# Patient Record
Sex: Female | Born: 1945 | Race: White | Hispanic: No | Marital: Married | Smoking: Never smoker
Health system: Southern US, Community
[De-identification: ages and names within clinical notes are randomized; demographics above are authoritative.]

## PROBLEM LIST (undated history)

## (undated) DIAGNOSIS — I10 Essential (primary) hypertension: Secondary | ICD-10-CM

## (undated) DIAGNOSIS — I4891 Unspecified atrial fibrillation: Secondary | ICD-10-CM

## (undated) HISTORY — PX: BREAST SURGERY: SHX581

## (undated) HISTORY — PX: APPENDECTOMY: SHX54

---

## 1979-07-22 HISTORY — PX: REDUCTION MAMMAPLASTY: SUR839

## 2004-10-09 ENCOUNTER — Ambulatory Visit: Payer: Self-pay | Admitting: Obstetrics and Gynecology

## 2005-03-20 ENCOUNTER — Ambulatory Visit: Payer: Self-pay | Admitting: Obstetrics and Gynecology

## 2005-12-03 ENCOUNTER — Ambulatory Visit: Payer: Self-pay | Admitting: Obstetrics and Gynecology

## 2005-12-04 ENCOUNTER — Ambulatory Visit: Payer: Self-pay | Admitting: General Surgery

## 2006-01-20 ENCOUNTER — Ambulatory Visit: Payer: Self-pay | Admitting: Plastic Surgery

## 2006-01-20 ENCOUNTER — Other Ambulatory Visit: Payer: Self-pay

## 2006-11-17 ENCOUNTER — Emergency Department: Payer: Self-pay | Admitting: Emergency Medicine

## 2006-12-16 ENCOUNTER — Ambulatory Visit: Payer: Self-pay | Admitting: Obstetrics and Gynecology

## 2007-12-20 ENCOUNTER — Ambulatory Visit: Payer: Self-pay | Admitting: Obstetrics and Gynecology

## 2008-12-20 ENCOUNTER — Ambulatory Visit: Payer: Self-pay | Admitting: Obstetrics and Gynecology

## 2010-01-10 ENCOUNTER — Ambulatory Visit: Payer: Self-pay | Admitting: Obstetrics and Gynecology

## 2011-01-15 ENCOUNTER — Ambulatory Visit: Payer: Self-pay | Admitting: Obstetrics and Gynecology

## 2012-01-16 ENCOUNTER — Ambulatory Visit: Payer: Self-pay | Admitting: Obstetrics and Gynecology

## 2017-12-02 ENCOUNTER — Other Ambulatory Visit: Payer: Self-pay

## 2017-12-02 ENCOUNTER — Emergency Department
Admission: EM | Admit: 2017-12-02 | Discharge: 2017-12-02 | Disposition: A | Discharge status: Home / Self Care | Payer: Medicare Other | Attending: Emergency Medicine | Admitting: Emergency Medicine

## 2017-12-02 ENCOUNTER — Emergency Department: Payer: Medicare Other

## 2017-12-02 DIAGNOSIS — R002 Palpitations: Secondary | ICD-10-CM | POA: Diagnosis present

## 2017-12-02 DIAGNOSIS — I4891 Unspecified atrial fibrillation: Secondary | ICD-10-CM

## 2017-12-02 LAB — BASIC METABOLIC PANEL
Anion gap: 12 (ref 5–15)
BUN: 9 mg/dL (ref 6–20)
CALCIUM: 9.2 mg/dL (ref 8.9–10.3)
CO2: 22 mmol/L (ref 22–32)
CREATININE: 0.97 mg/dL (ref 0.44–1.00)
Chloride: 102 mmol/L (ref 101–111)
GFR calc Af Amer: >60 mL/min (ref >60)
GFR, EST NON AFRICAN AMERICAN: 57 mL/min — AB (ref >60)
GLUCOSE: 106 mg/dL — AB (ref 65–99)
Potassium: 4.5 mmol/L (ref 3.5–5.1)
Sodium: 136 mmol/L (ref 135–145)

## 2017-12-02 LAB — CBC
HCT: 45.9 % (ref 35.0–47.0)
Hemoglobin: 15.6 g/dL (ref 12.0–16.0)
MCH: 33.7 pg (ref 26.0–34.0)
MCHC: 33.9 g/dL (ref 32.0–36.0)
MCV: 99.4 fL (ref 80.0–100.0)
PLATELETS: 246 10*3/uL (ref 150–440)
RBC: 4.62 MIL/uL (ref 3.80–5.20)
RDW: 13 % (ref 11.5–14.5)
WBC: 7.8 10*3/uL (ref 3.6–11.0)

## 2017-12-02 LAB — TROPONIN I

## 2017-12-02 MED ORDER — DILTIAZEM HCL ER COATED BEADS 120 MG PO CP24: 120.0000 mg | ORAL_CAPSULE | Freq: Once | ORAL | Status: DC

## 2017-12-02 MED ORDER — DILTIAZEM HCL 60 MG PO TABS
60.0000 mg | ORAL_TABLET | Freq: Once | ORAL | Status: AC
  Administered 2017-12-02: 60 mg via ORAL
  Filled 2017-12-02: qty 1

## 2017-12-02 MED ORDER — DILTIAZEM HCL 25 MG/5ML IV SOLN
10.0000 mg | Freq: Once | INTRAVENOUS | Status: AC
  Administered 2017-12-02: 10 mg via INTRAVENOUS
  Filled 2017-12-02: qty 5

## 2017-12-02 MED ORDER — DILTIAZEM HCL 25 MG/5ML IV SOLN
10.0000 mg | Freq: Once | INTRAVENOUS | Status: AC
  Administered 2017-12-02: 10 mg via INTRAVENOUS

## 2017-12-02 MED ORDER — DILTIAZEM HCL ER COATED BEADS 120 MG PO CP24: 120.0000 mg | ORAL_CAPSULE | Freq: Every day | ORAL | 0 refills | Status: DC

## 2017-12-02 MED ORDER — SODIUM CHLORIDE 0.9 % IV BOLUS
1000.0000 mL | Freq: Once | INTRAVENOUS | Status: AC
  Administered 2017-12-02: 1000 mL via INTRAVENOUS

## 2017-12-02 NOTE — ED Provider Notes (Signed)
[facility] Emergency Department Provider Note  Time seen: 2:40 PM  I have reviewed the triage vital signs and the nursing notes.   HISTORY  Chief Complaint Cough    HPI Gloria Gill is a 72 y.o. female with no past medical history besides past alcohol use, takes no prescription medications, presents to the emergency department for cough, shortness of breath with exertion and palpitations.  According to the patient for the past 1 month she has noticed a dry cough, states she always has a mild cough but it has been worse.  Over the past 2 weeks she has been feeling short of breath especially with exertion and is feeling palpitations especially when she lies down at night.  Patient denies any chest pain.  Denies any shortness of breath at rest.  Denies any nausea or diaphoresis.  Patient denies any fever or infectious symptoms.  Largely negative review of systems otherwise.    History reviewed. No pertinent past medical history.  There are no active problems to display for this patient.   History reviewed. No pertinent surgical history.  Prior to Admission medications   Not on File    No Known Allergies  No family history on file.  Social History Social History   Tobacco Use  . Smoking status: Not on file  Substance Use Topics  . Alcohol use: Not Currently    Frequency: Never  . Drug use: Never    Review of Systems Constitutional: Negative for fever. Eyes: Negative for visual complaints ENT: Negative for recent illness/congestion Cardiovascular: Negative for chest pain.  Intermittent palpitations at night x2 weeks. Respiratory: Shortness of breath with exertion x2 weeks Gastrointestinal: Negative for abdominal pain, vomiting and diarrhea. Genitourinary: Negative for urinary compaints Musculoskeletal: Negative for leg pain or swelling. Skin: Negative for skin complaints  Neurological: Negative for headache All other ROS  negative  ____________________________________________   PHYSICAL EXAM:  VITAL SIGNS: ED Triage Vitals [12/02/17 1340]  Enc Vitals Group     BP 100/79     Pulse Rate (!) 141     Resp 18     Temp 98.5 F (36.9 C)     Temp Source Oral     SpO2 95 %     Weight 175 lb (79.4 kg)     Height 5\' 9"  (1.753 m)     Head Circumference      Peak Flow      Pain Score 0     Pain Loc      Pain Edu?      Excl. in [place]?    Constitutional: Alert and oriented. Well appearing and in no distress. Eyes: Normal exam ENT   Head: Normocephalic and atraumatic.   Mouth/Throat: Mucous membranes are moist. Cardiovascular: Irregular rhythm, rate around 115. Respiratory: Normal respiratory effort without tachypnea nor retractions. Breath sounds are clear  Gastrointestinal: Soft and nontender. No distention.  Musculoskeletal: Nontender with normal range of motion in all extremities. No lower extremity tenderness or edema. Neurologic:  Normal speech and language. No gross focal neurologic deficits Skin:  Skin is warm, dry and intact.  Psychiatric: Mood and affect are normal. Speech and behavior are normal.   ____________________________________________    EKG  EKG reviewed and interpreted by myself shows atrial fibrillation with rapid ventricular response at 145 bpm, narrow QRS, normal axis, normal intervals besides slight QTC prolongation, nonspecific ST changes.  Repeat EKG 15: 24: 04 reviewed and interpreted by myself shows atrial fibrillation at 94  bpm with a narrow QRS, normal axis,'s nonspecific ST changes.  ____________________________________________    RADIOLOGY  Chest x-ray shows cardiac enlargement with small bilateral effusions.  ____________________________________________   INITIAL IMPRESSION / ASSESSMENT AND PLAN / ED COURSE  Pertinent labs & imaging results that were available during my care of the patient were reviewed by me and considered in my medical decision making  (see chart for details).  Patient presents to the emergency department for dry cough, shortness of breath with exertion and intermittent palpitations over the past 2 weeks.  Patient found to be in rapid A. fib upon arrival.  Denies any chest pain, no shortness of breath at rest.  Differential would include atrial fibrillation with rapid ventricular response, ACS, alleged letter metabolic abnormality.  We will check labs, treat with diltiazem, IV hydrate, and continue to closely monitor.  Patient agreeable to this plan of care.  Patient's heart rate has come down with diltiazem after the second bolus patient's heart rate is maintaining in the 80s and 90s.  Patient is feeling better.  Chest x-ray shows possible mild fluid overload.  Labs are largely within normal limits including normal kidney function, normal H&H and negative troponin.  We will discuss with unassigned cardiology for further recommendations.  I will cover the patient was 60 mg of oral diltiazem to attempt to maintain rate control.  I discussed the patient with Dr. Ubaldo Glassing.  He recommends holding off on anticoagulation at this time.  He will see in the office tomorrow at 1130.  We will discharge with diltiazem.  Patient agreeable to this plan of care.   CRITICAL CARE Performed by: Harvest Dark   Total critical care time: 30 minutes  Critical care time was exclusive of separately billable procedures and treating other patients.  Critical care was necessary to treat or prevent imminent or life-threatening deterioration.  Critical care was time spent personally by me on the following activities: development of treatment plan with patient and/or surrogate as well as nursing, discussions with consultants, evaluation of patient's response to treatment, examination of patient, obtaining history from patient or surrogate, ordering and performing treatments and interventions, ordering and review of laboratory studies, ordering and review  of radiographic studies, pulse oximetry and re-evaluation of patient's condition.  ____________________________________________   FINAL CLINICAL IMPRESSION(S) / ED DIAGNOSES  Atrial fibrillation with rapid ventricular response    Harvest Dark, MD 12/02/17 1535

## 2017-12-02 NOTE — Discharge Instructions (Addendum)
Please follow-up with Dr. Ubaldo Glassing in the office tomorrow at 11:30 AM.  Return to the emergency department for any chest pain or trouble breathing.  Please take your medication as prescribed starting tomorrow morning.

## 2017-12-02 NOTE — ED Notes (Signed)
No hx of irregular HB

## 2017-12-02 NOTE — ED Triage Notes (Signed)
To ER via POV c/o acute bronchitis. Pt states that she was started on azithromycin and benzonate Monday. Pt states she has dry cough and has not improved. Pt alert and oriented X4, active, cooperative, pt in NAD. RR even and unlabored, color WNL.

## 2017-12-08 DIAGNOSIS — I4891 Unspecified atrial fibrillation: Secondary | ICD-10-CM | POA: Insufficient documentation

## 2018-05-06 DIAGNOSIS — I429 Cardiomyopathy, unspecified: Secondary | ICD-10-CM

## 2018-05-06 DIAGNOSIS — F1010 Alcohol abuse, uncomplicated: Secondary | ICD-10-CM | POA: Diagnosis present

## 2018-05-06 DIAGNOSIS — F411 Generalized anxiety disorder: Secondary | ICD-10-CM | POA: Insufficient documentation

## 2018-05-22 ENCOUNTER — Other Ambulatory Visit: Payer: Self-pay

## 2018-05-22 ENCOUNTER — Emergency Department
Admission: EM | Admit: 2018-05-22 | Discharge: 2018-05-22 | Disposition: A | Discharge status: Home / Self Care | Payer: Medicare Other | Attending: Emergency Medicine | Admitting: Emergency Medicine

## 2018-05-22 ENCOUNTER — Encounter: Payer: Self-pay | Admitting: Emergency Medicine

## 2018-05-22 DIAGNOSIS — I10 Essential (primary) hypertension: Secondary | ICD-10-CM | POA: Insufficient documentation

## 2018-05-22 DIAGNOSIS — R040 Epistaxis: Secondary | ICD-10-CM | POA: Insufficient documentation

## 2018-05-22 DIAGNOSIS — Z7901 Long term (current) use of anticoagulants: Secondary | ICD-10-CM | POA: Diagnosis not present

## 2018-05-22 DIAGNOSIS — Z79899 Other long term (current) drug therapy: Secondary | ICD-10-CM | POA: Diagnosis not present

## 2018-05-22 HISTORY — DX: Unspecified atrial fibrillation: I48.91

## 2018-05-22 HISTORY — DX: Essential (primary) hypertension: I10

## 2018-05-22 LAB — BASIC METABOLIC PANEL
Anion gap: 11 (ref 5–15)
BUN: 15 mg/dL (ref 8–23)
CALCIUM: 9.6 mg/dL (ref 8.9–10.3)
CO2: 26 mmol/L (ref 22–32)
CREATININE: 0.83 mg/dL (ref 0.44–1.00)
Chloride: 102 mmol/L (ref 98–111)
Glucose, Bld: 106 mg/dL — ABNORMAL HIGH (ref 70–99)
Potassium: 3.9 mmol/L (ref 3.5–5.1)
SODIUM: 139 mmol/L (ref 135–145)

## 2018-05-22 LAB — CBC
HCT: 45.3 % (ref 36.0–46.0)
Hemoglobin: 15.3 g/dL — ABNORMAL HIGH (ref 12.0–15.0)
MCH: 34 pg (ref 26.0–34.0)
MCHC: 33.8 g/dL (ref 30.0–36.0)
MCV: 100.7 fL — ABNORMAL HIGH (ref 80.0–100.0)
Platelets: 255 10*3/uL (ref 150–400)
RBC: 4.5 MIL/uL (ref 3.87–5.11)
RDW: 11.8 % (ref 11.5–15.5)
WBC: 8.9 10*3/uL (ref 4.0–10.5)
nRBC: 0 % (ref 0.0–0.2)

## 2018-05-22 LAB — PROTIME-INR
INR: 1.18
PROTHROMBIN TIME: 14.9 s (ref 11.4–15.2)

## 2018-05-22 LAB — APTT: aPTT: 31 seconds (ref 24–36)

## 2018-05-22 NOTE — ED Provider Notes (Signed)
[facility] Emergency Department Provider Note   ____________________________________________   First MD Initiated Contact with Patient 05/22/18 1255     (approximate)  I have reviewed the triage vital signs and the nursing notes.   HISTORY  Chief Complaint Epistaxis    HPI Gloria Gill is a 72 y.o. female here for evaluation of a nosebleed  Patient reports she began expensing bleeding from her left nose yesterday.  She reports its been oozing, she was able to get stopped with pressure and stuffing a tissue paper in it.  This morning after sneezing it began to ooze once again, prompting her to come for evaluation.  She reports she does take blood thinner for her A. fib, last took at 630 this morning.  No history of any known blood clots.  She does report the bleeding has since stopped, she held pressure over the left side of the nose and stop bleeding.  No fevers or chills.  Does not feel blood running down the back of her throat.  No vomiting.  No lightheadedness or weakness.  No recent change in her medications.   Past Medical History:  Diagnosis Date  . Atrial fibrillation ([place])   . Hypertension     There are no active problems to display for this patient.   Past Surgical History:  Procedure Laterality Date  . APPENDECTOMY    . BREAST SURGERY      Prior to Admission medications   Medication Sig Start Date End Date Taking? Authorizing Provider  albuterol (PROVENTIL HFA;VENTOLIN HFA) 108 (90 Base) MCG/ACT inhaler Inhale 1-2 puffs into the lungs every 6 (six) hours as needed. 11/30/17   [provider]  azithromycin (ZITHROMAX) 250 MG tablet Take 2 tablets (500MG ) by mouth first day then take 1 tablet (250MG ) by mouth daily for next 4 days 11/30/17   [provider]  benzonatate (TESSALON) 200 MG capsule Take 1 capsule by mouth 3 (three) times daily as needed. 11/30/17   [provider]  diltiazem (CARDIZEM CD)  120 MG 24 hr capsule Take 1 capsule (120 mg total) by mouth daily. 12/02/17 12/02/18  Harvest Dark, MD    Allergies Patient has no known allergies.  No family history on file.  Social History Social History   Tobacco Use  . Smoking status: Never Smoker  Substance Use Topics  . Alcohol use: Not Currently    Frequency: Never  . Drug use: Never    Review of Systems Constitutional: No fever/chills ENT: No sore throat.  See HPI regarding epistaxis.  No bleeding from the gumline. Cardiovascular: Denies chest pain. Respiratory: Denies shortness of breath. Gastrointestinal: No abdominal pain.   Genitourinary: Negative for blood in the urine..  Musculoskeletal: Negative for back pain. Skin: No easy bruising. Neurological: Negative for headaches or weakness.    ____________________________________________   PHYSICAL EXAM:  VITAL SIGNS: ED Triage Vitals  Enc Vitals Group     BP 05/22/18 1115 (!) 149/106     Pulse Rate 05/22/18 1115 (!) 53     Resp 05/22/18 1115 18     Temp 05/22/18 1115 98 F (36.7 C)     Temp Source 05/22/18 1115 Oral     SpO2 05/22/18 1115 95 %     Weight 05/22/18 1118 173 lb (78.5 kg)     Height 05/22/18 1118 5' 10.5" (1.791 m)     Head Circumference --      Peak Flow --      Pain  Score 05/22/18 1117 0     Pain Loc --      Pain Edu? --      Excl. in [place]? --     Constitutional: Alert and oriented. Well appearing and in no acute distress. Eyes: Conjunctivae are normal. Head: Atraumatic. Nose: No congestion/rhinnorhea.  The right nare is clear and free from any bleeding.  The left nare has adherent clot without evidence of bleeding in the anterior portion of the nare.  There is no blood or bleeding in the posterior pharynx.  Patient reports the left side of the nose is no longer bleeding Mouth/Throat: Mucous membranes are moist. Neck: No stridor.  Cardiovascular: Normal rate, regular rhythm. Grossly normal heart sounds.  Good peripheral  circulation. Respiratory: Normal respiratory effort.  No retractions. Lungs CTAB. Gastrointestinal: Soft and nontender. No distention. Musculoskeletal: No lower extremity tenderness nor edema. Neurologic:  Normal speech and language. No gross focal neurologic deficits are appreciated.  Skin:  Skin is warm, dry and intact. No rash noted. Psychiatric: Mood and affect are normal. Speech and behavior are normal.  ____________________________________________   LABS (all labs ordered are listed, but only abnormal results are displayed)  Labs Reviewed  CBC - Abnormal; Notable for the following components:      Result Value   Hemoglobin 15.3 (*)    MCV 100.7 (*)    All other components within normal limits  BASIC METABOLIC PANEL - Abnormal; Notable for the following components:   Glucose, Bld 106 (*)    All other components within normal limits  PROTIME-INR  APTT   ____________________________________________  EKG   ____________________________________________  RADIOLOGY   ____________________________________________   PROCEDURES  Procedure(s) performed: None  Procedures  Critical Care performed: No  ____________________________________________   INITIAL IMPRESSION / ASSESSMENT AND PLAN / ED COURSE  Pertinent labs & imaging results that were available during my care of the patient were reviewed by me and considered in my medical decision making (see chart for details).   Patient returns for evaluation of epistaxis.  Labs checked as she is on Eliquis.  At the time of presentation her nose is no longer bleeding after she applied anterior pressure and use of Kleenex.  There is no evidence of ongoing bleeding.  Patient resting comfortably no distress, labs checked platelet count normal and renal function normal.  Has a bleeding is stopped I do not believe we should withdraw her anticoagulation, but will refer her for follow-up.  Discussed careful return precautions and at  home she recommendations as well as return precautions for epistaxis.  Patient and family in agreement  Return precautions and treatment recommendations and follow-up discussed with the patient who is agreeable with the plan.  ----------------------------------------- 2:34 PM on 05/22/2018 -----------------------------------------  Patient observed, no ongoing epistaxis on reevaluation at this time either.  Discharged home      ____________________________________________   FINAL CLINICAL IMPRESSION(S) / ED DIAGNOSES  Final diagnoses:  Left-sided epistaxis        Note:  This document was prepared using Dragon voice recognition software and may include unintentional dictation errors       Delman Kitten, MD 05/22/18 1434

## 2018-05-22 NOTE — ED Triage Notes (Signed)
Nosebleed L draining from L nare since yesterday. Denies injury. On Eliquis. States has been slowing down since yesterday. Has tissue in L nare with noted small amount of pink drainage.

## 2018-05-22 NOTE — Discharge Instructions (Addendum)
As we discussed, there are several techniques you can use to prevent or stop nosebleeds in the future.  Keep your nose moist either with saline spray several times a day.  If the bleeding starts up again, gently blow your nose into a tissue to clear the blood and clots, then apply 1-2 sprays to each affected nostril of over-the-counter Afrin nasal spray (oxymetazoline).   Then squeeze your nose shut tightly and DO NOT PEEK for at least 15 minutes.  This will resolve most nosebleeds.  If you continue to have trouble after trying these techniques, or anything seems out of the ordinary or concerns you, please return tot he Emergency Department.

## 2020-05-22 ENCOUNTER — Other Ambulatory Visit: Payer: Self-pay | Admitting: Internal Medicine

## 2020-05-22 DIAGNOSIS — Z1231 Encounter for screening mammogram for malignant neoplasm of breast: Secondary | ICD-10-CM

## 2020-07-26 ENCOUNTER — Other Ambulatory Visit: Payer: Self-pay

## 2020-07-26 ENCOUNTER — Ambulatory Visit
Admission: RE | Admit: 2020-07-26 | Discharge: 2020-07-26 | Disposition: A | Discharge status: Home / Self Care | Payer: Medicare PPO | Source: Ambulatory Visit | Attending: Internal Medicine | Admitting: Internal Medicine

## 2020-07-26 DIAGNOSIS — Z1231 Encounter for screening mammogram for malignant neoplasm of breast: Secondary | ICD-10-CM

## 2020-09-19 ENCOUNTER — Emergency Department: Payer: Medicare PPO

## 2020-09-19 ENCOUNTER — Inpatient Hospital Stay
Admission: EM | Admit: 2020-09-19 | Discharge: 2020-10-02 | DRG: 660 | Disposition: A | Discharge status: Home / Self Care | Payer: Medicare PPO | Attending: Internal Medicine | Admitting: Internal Medicine

## 2020-09-19 ENCOUNTER — Other Ambulatory Visit: Payer: Self-pay

## 2020-09-19 DIAGNOSIS — C801 Malignant (primary) neoplasm, unspecified: Secondary | ICD-10-CM | POA: Diagnosis present

## 2020-09-19 DIAGNOSIS — Z79899 Other long term (current) drug therapy: Secondary | ICD-10-CM | POA: Diagnosis not present

## 2020-09-19 DIAGNOSIS — Z7901 Long term (current) use of anticoagulants: Secondary | ICD-10-CM

## 2020-09-19 DIAGNOSIS — I4891 Unspecified atrial fibrillation: Secondary | ICD-10-CM | POA: Diagnosis present

## 2020-09-19 DIAGNOSIS — N136 Pyonephrosis: Secondary | ICD-10-CM | POA: Diagnosis present

## 2020-09-19 DIAGNOSIS — I5020 Unspecified systolic (congestive) heart failure: Secondary | ICD-10-CM

## 2020-09-19 DIAGNOSIS — I110 Hypertensive heart disease with heart failure: Secondary | ICD-10-CM | POA: Diagnosis present

## 2020-09-19 DIAGNOSIS — F459 Somatoform disorder, unspecified: Secondary | ICD-10-CM | POA: Diagnosis present

## 2020-09-19 DIAGNOSIS — F1020 Alcohol dependence, uncomplicated: Secondary | ICD-10-CM | POA: Diagnosis present

## 2020-09-19 DIAGNOSIS — I10 Essential (primary) hypertension: Secondary | ICD-10-CM | POA: Diagnosis not present

## 2020-09-19 DIAGNOSIS — N179 Acute kidney failure, unspecified: Principal | ICD-10-CM | POA: Diagnosis present

## 2020-09-19 DIAGNOSIS — N132 Hydronephrosis with renal and ureteral calculous obstruction: Secondary | ICD-10-CM | POA: Diagnosis not present

## 2020-09-19 DIAGNOSIS — K5900 Constipation, unspecified: Secondary | ICD-10-CM | POA: Diagnosis present

## 2020-09-19 DIAGNOSIS — Z888 Allergy status to other drugs, medicaments and biological substances status: Secondary | ICD-10-CM | POA: Diagnosis not present

## 2020-09-19 DIAGNOSIS — G893 Neoplasm related pain (acute) (chronic): Secondary | ICD-10-CM | POA: Diagnosis not present

## 2020-09-19 DIAGNOSIS — F451 Undifferentiated somatoform disorder: Secondary | ICD-10-CM

## 2020-09-19 DIAGNOSIS — Z20822 Contact with and (suspected) exposure to covid-19: Secondary | ICD-10-CM | POA: Diagnosis present

## 2020-09-19 DIAGNOSIS — Z791 Long term (current) use of non-steroidal anti-inflammatories (NSAID): Secondary | ICD-10-CM

## 2020-09-19 DIAGNOSIS — C7911 Secondary malignant neoplasm of bladder: Secondary | ICD-10-CM | POA: Diagnosis present

## 2020-09-19 DIAGNOSIS — N135 Crossing vessel and stricture of ureter without hydronephrosis: Secondary | ICD-10-CM | POA: Diagnosis not present

## 2020-09-19 DIAGNOSIS — N3289 Other specified disorders of bladder: Secondary | ICD-10-CM | POA: Diagnosis not present

## 2020-09-19 DIAGNOSIS — R109 Unspecified abdominal pain: Secondary | ICD-10-CM

## 2020-09-19 DIAGNOSIS — C579 Malignant neoplasm of female genital organ, unspecified: Secondary | ICD-10-CM | POA: Diagnosis not present

## 2020-09-19 DIAGNOSIS — Z515 Encounter for palliative care: Secondary | ICD-10-CM | POA: Diagnosis not present

## 2020-09-19 DIAGNOSIS — R310 Gross hematuria: Secondary | ICD-10-CM | POA: Diagnosis not present

## 2020-09-19 DIAGNOSIS — F1011 Alcohol abuse, in remission: Secondary | ICD-10-CM | POA: Diagnosis not present

## 2020-09-19 DIAGNOSIS — I426 Alcoholic cardiomyopathy: Secondary | ICD-10-CM | POA: Diagnosis present

## 2020-09-19 DIAGNOSIS — I5022 Chronic systolic (congestive) heart failure: Secondary | ICD-10-CM | POA: Diagnosis present

## 2020-09-19 DIAGNOSIS — F1010 Alcohol abuse, uncomplicated: Secondary | ICD-10-CM | POA: Diagnosis present

## 2020-09-19 DIAGNOSIS — I429 Cardiomyopathy, unspecified: Secondary | ICD-10-CM

## 2020-09-19 DIAGNOSIS — N1330 Unspecified hydronephrosis: Secondary | ICD-10-CM

## 2020-09-19 DIAGNOSIS — F332 Major depressive disorder, recurrent severe without psychotic features: Secondary | ICD-10-CM | POA: Diagnosis present

## 2020-09-19 DIAGNOSIS — I480 Paroxysmal atrial fibrillation: Secondary | ICD-10-CM | POA: Diagnosis present

## 2020-09-19 DIAGNOSIS — D494 Neoplasm of unspecified behavior of bladder: Secondary | ICD-10-CM | POA: Diagnosis not present

## 2020-09-19 DIAGNOSIS — R45851 Suicidal ideations: Secondary | ICD-10-CM

## 2020-09-19 LAB — CBC
HCT: 40.3 % (ref 36.0–46.0)
Hemoglobin: 13.3 g/dL (ref 12.0–15.0)
MCH: 31 pg (ref 26.0–34.0)
MCHC: 33 g/dL (ref 30.0–36.0)
MCV: 93.9 fL (ref 80.0–100.0)
Platelets: 404 10*3/uL — ABNORMAL HIGH (ref 150–400)
RBC: 4.29 MIL/uL (ref 3.87–5.11)
RDW: 12.4 % (ref 11.5–15.5)
WBC: 12.7 10*3/uL — ABNORMAL HIGH (ref 4.0–10.5)
nRBC: 0 % (ref 0.0–0.2)

## 2020-09-19 LAB — URINE DRUG SCREEN, QUALITATIVE (ARMC ONLY)
Amphetamines, Ur Screen: NOT DETECTED
Barbiturates, Ur Screen: NOT DETECTED
Benzodiazepine, Ur Scrn: NOT DETECTED
Cannabinoid 50 Ng, Ur ~~LOC~~: NOT DETECTED
Cocaine Metabolite,Ur ~~LOC~~: NOT DETECTED
MDMA (Ecstasy)Ur Screen: NOT DETECTED
Methadone Scn, Ur: NOT DETECTED
Opiate, Ur Screen: POSITIVE — AB
Phencyclidine (PCP) Ur S: NOT DETECTED
Tricyclic, Ur Screen: NOT DETECTED

## 2020-09-19 LAB — COMPREHENSIVE METABOLIC PANEL
ALT: 21 U/L (ref 0–44)
AST: 25 U/L (ref 15–41)
Albumin: 3.8 g/dL (ref 3.5–5.0)
Alkaline Phosphatase: 49 U/L (ref 38–126)
Anion gap: 14 (ref 5–15)
BUN: 33 mg/dL — ABNORMAL HIGH (ref 8–23)
CO2: 20 mmol/L — ABNORMAL LOW (ref 22–32)
Calcium: 9.2 mg/dL (ref 8.9–10.3)
Chloride: 104 mmol/L (ref 98–111)
Creatinine, Ser: 3.08 mg/dL — ABNORMAL HIGH (ref 0.44–1.00)
GFR, Estimated: 15 mL/min — ABNORMAL LOW (ref >60)
Glucose, Bld: 210 mg/dL — ABNORMAL HIGH (ref 70–99)
Potassium: 4.1 mmol/L (ref 3.5–5.1)
Sodium: 138 mmol/L (ref 135–145)
Total Bilirubin: 0.8 mg/dL (ref 0.3–1.2)
Total Protein: 7.2 g/dL (ref 6.5–8.1)

## 2020-09-19 LAB — URINALYSIS, COMPLETE (UACMP) WITH MICROSCOPIC
Bilirubin Urine: NEGATIVE
Glucose, UA: NEGATIVE mg/dL
Ketones, ur: NEGATIVE mg/dL
Nitrite: NEGATIVE
Protein, ur: 30 mg/dL — AB
Specific Gravity, Urine: 1.01 (ref 1.005–1.030)
pH: 5 (ref 5.0–8.0)

## 2020-09-19 LAB — LIPASE, BLOOD: Lipase: 35 U/L (ref 11–51)

## 2020-09-19 LAB — SALICYLATE LEVEL: Salicylate Lvl: <7 mg/dL — ABNORMAL LOW (ref 7.0–30.0)

## 2020-09-19 LAB — SARS CORONAVIRUS 2 (TAT 6-24 HRS): SARS Coronavirus 2: NEGATIVE

## 2020-09-19 LAB — ACETAMINOPHEN LEVEL: Acetaminophen (Tylenol), Serum: <10 ug/mL — ABNORMAL LOW (ref 10–30)

## 2020-09-19 LAB — ETHANOL: Alcohol, Ethyl (B): <10 mg/dL (ref <10)

## 2020-09-19 MED ORDER — METOPROLOL TARTRATE 5 MG/5ML IV SOLN
5.0000 mg | Freq: Once | INTRAVENOUS | Status: AC
  Administered 2020-09-19: 5 mg via INTRAVENOUS

## 2020-09-19 MED ORDER — SODIUM CHLORIDE 0.9% FLUSH
3.0000 mL | Freq: Two times a day (BID) | INTRAVENOUS | Status: DC
  Administered 2020-09-19 – 2020-10-02 (×26): 3 mL via INTRAVENOUS

## 2020-09-19 MED ORDER — MORPHINE SULFATE (PF) 4 MG/ML IV SOLN
4.0000 mg | Freq: Once | INTRAVENOUS | Status: AC
  Administered 2020-09-19: 4 mg via INTRAVENOUS
  Filled 2020-09-19: qty 1

## 2020-09-19 MED ORDER — SODIUM CHLORIDE 0.9 % IV SOLN
1000.0000 mL | Freq: Once | INTRAVENOUS | Status: AC
  Administered 2020-09-19: 1000 mL via INTRAVENOUS

## 2020-09-19 MED ORDER — METOPROLOL TARTRATE 5 MG/5ML IV SOLN
INTRAVENOUS | Status: AC
  Administered 2020-09-19: 5 mg via INTRAVENOUS
  Filled 2020-09-19: qty 5

## 2020-09-19 MED ORDER — METOPROLOL TARTRATE 5 MG/5ML IV SOLN: 5.0000 mg | Freq: Once | INTRAVENOUS | Status: AC

## 2020-09-19 MED ORDER — ONDANSETRON HCL 4 MG/2ML IJ SOLN
4.0000 mg | Freq: Once | INTRAMUSCULAR | Status: AC
  Administered 2020-09-19: 4 mg via INTRAVENOUS
  Filled 2020-09-19: qty 2

## 2020-09-19 MED ORDER — SODIUM CHLORIDE 0.9% FLUSH
3.0000 mL | INTRAVENOUS | Status: DC | PRN
  Administered 2020-09-21: 3 mL via INTRAVENOUS

## 2020-09-19 MED ORDER — HEPARIN SODIUM (PORCINE) 5000 UNIT/ML IJ SOLN: 5000.0000 [IU] | Freq: Three times a day (TID) | INTRAMUSCULAR | Status: DC

## 2020-09-19 MED ORDER — TRAZODONE HCL 50 MG PO TABS
25.0000 mg | ORAL_TABLET | Freq: Every evening | ORAL | Status: DC | PRN
  Administered 2020-09-19 – 2020-09-21 (×3): 25 mg via ORAL
  Filled 2020-09-19 (×3): qty 1

## 2020-09-19 MED ORDER — SODIUM CHLORIDE 0.9 % IV SOLN: 250.0000 mL | INTRAVENOUS | Status: DC | PRN

## 2020-09-19 MED ORDER — METOPROLOL SUCCINATE ER 100 MG PO TB24
100.0000 mg | ORAL_TABLET | Freq: Every day | ORAL | Status: DC
  Administered 2020-09-19 – 2020-09-30 (×12): 100 mg via ORAL
  Filled 2020-09-19 (×5): qty 1
  Filled 2020-09-19: qty 2
  Filled 2020-09-19 (×3): qty 1
  Filled 2020-09-19: qty 2
  Filled 2020-09-19 (×2): qty 1

## 2020-09-19 MED ORDER — APIXABAN 5 MG PO TABS
5.0000 mg | ORAL_TABLET | Freq: Two times a day (BID) | ORAL | Status: DC
  Administered 2020-09-19: 5 mg via ORAL
  Filled 2020-09-19: qty 1

## 2020-09-19 MED ORDER — MORPHINE SULFATE (PF) 4 MG/ML IV SOLN
4.0000 mg | INTRAVENOUS | Status: DC | PRN
  Administered 2020-09-20 – 2020-09-28 (×4): 4 mg via INTRAVENOUS
  Filled 2020-09-19 (×4): qty 1

## 2020-09-19 MED ORDER — DILTIAZEM HCL 25 MG/5ML IV SOLN
5.0000 mg | Freq: Once | INTRAVENOUS | Status: AC
  Administered 2020-09-19: 5 mg via INTRAVENOUS
  Filled 2020-09-19: qty 5

## 2020-09-19 NOTE — Consult Note (Signed)
Urology Consult  I have been asked to see the patient by Dr. Si Raider, for evaluation and management of hydronephrosis/acute kidney injury.  Chief Complaint: Flank pain, urinary incontinence  History of Present Illness: Gloria Gill is a 75 y.o. year old female who presents to the emergency room today with various complaints including crampy lower abdominal pain, low back pain and new onset urinary incontinence.  As part of her evaluation, she underwent routine labs indicating newly identified acute renal insufficiency with a creatinine of 3, previous baseline normal as well as noncontrast CT scan indicating new bilateral hydronephrosis down to the level of a decompressed bladder.  She reports that about 3 weeks ago, she went to her primary care complaining of new onset urinary incontinence.  She complained of urgency, frequency, lower abdominal discomfort as well as severe incontinence.  She is now  having to wear incontinence pads.  She denies any gross hematuria or dysuria.  She does not think the oxybutynin has helped much this prescribed by her primary care physician.  She denies any personal history of urinary symptoms prior to this.  She is a never smoker.  She did endorse some suicidal ideations being evaluated by psychiatry today.  Urinalysis today shows a few white blood cells, likely contaminant without an overt infection.  She has been taking antibiotics for presumed clinical diverticulitis.  Previous imaging dating back to 2006 shows no evidence of hydronephrosis bilaterally.  Past Medical History:  Diagnosis Date  . Atrial fibrillation ([place])   . Hypertension     Past Surgical History:  Procedure Laterality Date  . APPENDECTOMY    . BREAST SURGERY    . REDUCTION MAMMAPLASTY Bilateral 1981   pt stated she had implants with these scars/had implants removed 2009    Home Medications:  Current Meds  Medication Sig  . ELIQUIS 5 MG TABS tablet Take 5 mg by mouth  2 (two) times daily.  . metoprolol succinate (TOPROL-XL) 100 MG 24 hr tablet Take 100 mg by mouth daily.  Marland Kitchen oxybutynin (DITROPAN-XL) 10 MG 24 hr tablet Take 10 mg by mouth daily.  . [DISCONTINUED] amoxicillin-clavulanate (AUGMENTIN) 875-125 MG tablet Take 1 tablet by mouth every 12 (twelve) hours.    Allergies:  Allergies  Allergen Reactions  . Lisinopril Nausea Only    Family History  Problem Relation Age of Onset  . Breast cancer Neg Hx     Social History:  reports that she has never smoked. She has never used smokeless tobacco. She reports previous alcohol use. She reports that she does not use drugs.  ROS: A complete review of systems was performed.  All systems are negative except for pertinent findings as noted.  Physical Exam:  Vital signs in last 24 hours: Temp:  [98.4 F (36.9 C)] 98.4 F (36.9 C) (03/02 0941) Pulse Rate:  [53-155] 149 (03/02 1631) Resp:  [15-18] 15 (03/02 1631) BP: (137-138)/(98-114) 138/98 (03/02 1549) SpO2:  [93 %-100 %] 95 % (03/02 1631) Weight:  [72.6 kg] 72.6 kg (03/02 0948) Constitutional:  Alert and oriented, No acute distress HEENT: [place] AT, moist mucus membranes.  Trachea midline, no masses Respiratory: Normal respiratory effort GI: Abdomen is soft, nontender, nondistended, no abdominal masses GU: No CVA tenderness Neurologic: Grossly intact, no focal deficits, moving all 4 extremities Psychiatric: Normal mood and affect   Laboratory Data:  Recent Labs    09/19/20 0945  WBC 12.7*  HGB 13.3  HCT 40.3   Recent Labs  09/19/20 0945  NA 138  K 4.1  CL 104  CO2 20*  GLUCOSE 210*  BUN 33*  CREATININE 3.08*  CALCIUM 9.2   Component     Latest Ref Rng & Units 09/19/2020  Color, Urine     YELLOW YELLOW (A)  Appearance     CLEAR HAZY (A)  Specific Gravity, Urine     1.005 - 1.030 1.010  pH     5.0 - 8.0 5.0  Glucose, UA     NEGATIVE mg/dL NEGATIVE  Hgb urine dipstick     NEGATIVE MODERATE (A)  Bilirubin Urine      NEGATIVE NEGATIVE  Ketones, ur     NEGATIVE mg/dL NEGATIVE  Protein     NEGATIVE mg/dL 30 (A)  Nitrite     NEGATIVE NEGATIVE  Leukocytes,Ua     NEGATIVE MODERATE (A)  RBC / HPF     0 - 5 RBC/hpf 0-5  WBC, UA     0 - 5 WBC/hpf 6-10  Bacteria, UA     NONE SEEN FEW (A)  Squamous Epithelial / LPF     0 - 5 0-5  Hyaline Casts, UA      PRESENT    Radiologic Imaging: CT ABDOMEN PELVIS WO CONTRAST  Result Date: 09/19/2020 CLINICAL DATA:  Left lower quadrant pain. EXAM: CT ABDOMEN AND PELVIS WITHOUT CONTRAST TECHNIQUE: Multidetector CT imaging of the abdomen and pelvis was performed following the standard protocol without IV contrast. COMPARISON:  03/20/2005 FINDINGS: Lower chest: Small right pleural effusion.  No confluent opacities. Hepatobiliary: No focal hepatic abnormality. Gallbladder unremarkable. Pancreas: No focal abnormality or ductal dilatation. Spleen: No focal abnormality.  Normal size. Adrenals/Urinary Tract: Bilateral hydronephrosis. No renal or ureteral stones. The ureters are dilated to the urinary bladder which is decompressed. Adrenal glands unremarkable. Stomach/Bowel: Colonic diverticulosis diffusely. No active diverticulitis. Stomach and small bowel decompressed, unremarkable. Vascular/Lymphatic: Aortic atherosclerosis. No evidence of aneurysm or adenopathy. Reproductive: Uterus and adnexa unremarkable.  No mass. Other: Free fluid noted in the pelvis anterior to the uterus. No free air. Musculoskeletal: No acute bony abnormality. IMPRESSION: Diffuse colonic diverticulosis.  No active diverticulitis. Bilateral hydronephrosis of unknown etiology. Ureters are dilated to the bladder. No visible stones. Bladder is decompressed. Small amount of free fluid in the pelvis. Aortic atherosclerosis. Small right pleural effusion. Electronically Signed   By: Rolm Baptise M.D.   On: 09/19/2020 10:44   CT scan was personally reviewed.  Agree with radiologic interpretation.  The urinary bladder  is intact decompressed albeit slightly thickened and irregular which may be related to decompressed state versus underlying bladder pathology.  Impression/ Plan:  75 year old female with new onset severe urinary urgency/urge incontinence with what appears to be new bilateral hydronephrosis.  Review of CT scan indicates as above, the bladder does appear to be mildly thickened albeit decompressed so evaluation is somewhat difficult.  Differential diagnosis for the above includes cystitis although unlikely (urine culture sent to rule this out), chronic bladder dysfunction with low volume high pressure bladder versus possibly even a bladder malignancy.  She has as acute kidney injury with a creatinine of 3.  It is unclear whether or not her hydronephrosis is obstructive in nature.  I recommend a Foley catheter for maximal urinary decompression at this time at least ~creatinine continues to trend downward.  May be helpful to check a renal ultrasound in 48 hours if she does improve to see if her hydronephrosis has resolved.  She may need urodynamics as an  outpatient as well as cystoscopy.  -Follow-up urine culture -Maintain Foley catheter until creatinine continues to improve -Consider renal ultrasound in 48 hours to see if hydronephrosis resolves with catheter in place -Outpatient cystoscopy +/- urodynamics   09/19/2020, 4:32 PM  Hollice Espy,  MD   Urology will continue to follow this patient.

## 2020-09-19 NOTE — ED Notes (Signed)
Pt requesting sleep aid -- will notify on call provider

## 2020-09-19 NOTE — ED Triage Notes (Addendum)
Pt comes via [place] with c/o abdominal pain. Pt states this started two weeks ago. Pt states she is having SI and plans to wreck her car. Pt states she just can't stand the pain anymore. Pt states she can't eat or sleep.  Pt states her husband at home is not helpful. Pt denies any HI. Pt denies any drug or alcohol.  Pt tearful in triage. Pt states she needs help.

## 2020-09-19 NOTE — ED Notes (Signed)
Pt still needs labs drawn

## 2020-09-19 NOTE — ED Notes (Addendum)
Pt noted to be in AFIB with RVR, rate as high as 164. EKG obtained, MD [facility] contacted via secure chart. EKG given to ER physician Charna Archer, see order for 5mg  iv metoprolol. Pt denies symptoms at this time. Denies CP, denies SHOB.  BP 138/98.

## 2020-09-19 NOTE — ED Notes (Addendum)
Pt dressed out in hospital attire. Pt's belongings to include: 1 brown bra 1 gray mask 1 blue shirt 1 gray pants 2 white socks 2 gray shoes 1 black hair tie 2 bobby pins 1 white underwear

## 2020-09-19 NOTE — Consult Note (Signed)
ANTICOAGULATION CONSULT NOTE - Consult  Pharmacy Consult for Eliquis Indication: atrial fibrillation  Allergies  Allergen Reactions  . Lisinopril Nausea Only    Patient Measurements: Height: 5' 9.5" (176.5 cm) Weight: 72.6 kg (160 lb) IBW/kg (Calculated) : 67.35  Vital Signs: Temp: 98.4 F (36.9 C) (03/02 0941) BP: 137/114 (03/02 0941) Pulse Rate: 53 (03/02 0941)  Labs: Recent Labs    09/19/20 0945  HGB 13.3  HCT 40.3  PLT 404*  CREATININE 3.08*    Estimated Creatinine Clearance: 16.5 mL/min (A) (by C-G formula based on SCr of 3.08 mg/dL (H)).   Medications:  Eliquis 5mg  BID PTA, no other APT.  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 Pharmacy consulted for the assessment and restart of PTA Eliquis.   Plan:  H/H/Plts WNL, no s/sy of bleed. AKI to be assessed by urology and decompress bladder. Creatinine prior was WNL, but will continue to monitor labs.  Last Eliquis dose 3/2 @ 0630; Will restart Eliquis 5mg  BID @1900 .  Lorna Dibble 09/19/2020,3:09 PM

## 2020-09-19 NOTE — ED Notes (Signed)
Dr Weber Cooks at bedside talking with pt.

## 2020-09-19 NOTE — Consult Note (Signed)
[facility] Face-to-Face Psychiatry Consult   Reason for Consult:   Consult for 75 year old woman with a history of alcohol abuse and medical problems who comes in with abdominal pain and suicidal ideation Referring Physician:  [facility] Patient Identification: Gloria Gill MRN:  [id number] Principal Diagnosis: Severe recurrent major depression without psychotic features ([place]) Diagnosis:  Principal Problem:   Severe recurrent major depression without psychotic features ([place]) Active Problems:   Alcohol abuse   Cardiomyopathy ([place])   AKI (acute kidney injury) ([place])   HFrEF (heart failure with reduced ejection fraction) ([place])   Hydronephrosis   Suicidal ideation   Somatic symptom disorder   Total Time spent with patient: 1 hour  Subjective:   Gloria Gill is a 75 y.o. female patient admitted with "I cannot live like this".  HPI: Patient came to the emergency room complaining of abdominal pain.  Along the way she made statements about how she would kill herself if she had to go home in her current condition.  Patient tells me that for about the last 2 weeks she has had severe pain across the lower section bilaterally of her abdomen.  It is constant but gets worse when she eats.  It does not change with bowel movements or urinating.  Patient has been to doctors without any relief so far.  States that the pain keeps her awake at night and she cannot sleep.  Not eating well.  She says that she has thoughts about killing herself by wrecking her car if the pain does not get better.  Not currently receiving any mental health treatment.  Denies any hallucinations or psychosis.  Denies homicidal ideation.  Patient is generally seeming very down and negative.  It is easy to get her talking about how angry she is at her husband and complaining about other negative things in her life as well as the pain.  Past Psychiatric History: Past history of alcohol abuse says she has not had a drink in about 3  years.  Past history of some depression.  Denies past suicide attempts.  Risk to Self:   Risk to Others:   Prior Inpatient Therapy:   Prior Outpatient Therapy:    Past Medical History:  Past Medical History:  Diagnosis Date  . Atrial fibrillation ([place])   . Hypertension     Past Surgical History:  Procedure Laterality Date  . APPENDECTOMY    . BREAST SURGERY    . REDUCTION MAMMAPLASTY Bilateral 1981   pt stated she had implants with these scars/had implants removed 2009   Family History:  Family History  Problem Relation Age of Onset  . Breast cancer Neg Hx    Family Psychiatric  History: See previous Social History:  Social History   Substance and Sexual Activity  Alcohol Use Not Currently     Social History   Substance and Sexual Activity  Drug Use Never    Social History   Socioeconomic History  . Marital status: Married    Spouse name: Not on file  . Number of children: Not on file  . Years of education: Not on file  . Highest education level: Not on file  Occupational History  . Not on file  Tobacco Use  . Smoking status: Never Smoker  . Smokeless tobacco: Never Used  Substance and Sexual Activity  . Alcohol use: Not Currently  . Drug use: Never  . Sexual activity: Not on file  Other Topics Concern  . Not on file  Social  History Narrative  . Not on file   Social Determinants of Health   Financial Resource Strain: Not on file  Food Insecurity: Not on file  Transportation Needs: Not on file  Physical Activity: Not on file  Stress: Not on file  Social Connections: Not on file   Additional Social History:    Allergies:   Allergies  Allergen Reactions  . Lisinopril Nausea Only    Labs:  Results for orders placed or performed during the hospital encounter of 09/19/20 (from the past 48 hour(s))  Comprehensive metabolic panel     Status: Abnormal   Collection Time: 09/19/20  9:45 AM  Result Value Ref Range   Sodium 138 135 - 145 mmol/L    Potassium 4.1 3.5 - 5.1 mmol/L   Chloride 104 98 - 111 mmol/L   CO2 20 (L) 22 - 32 mmol/L   Glucose, Bld 210 (H) 70 - 99 mg/dL    Comment: Glucose reference range applies only to samples taken after fasting for at least 8 hours.   BUN 33 (H) 8 - 23 mg/dL   Creatinine, Ser 3.08 (H) 0.44 - 1.00 mg/dL   Calcium 9.2 8.9 - 10.3 mg/dL   Total Protein 7.2 6.5 - 8.1 g/dL   Albumin 3.8 3.5 - 5.0 g/dL   AST 25 15 - 41 U/L   ALT 21 0 - 44 U/L   Alkaline Phosphatase 49 38 - 126 U/L   Total Bilirubin 0.8 0.3 - 1.2 mg/dL   GFR, Estimated 15 (L) >60 mL/min    Comment: (NOTE) Calculated using the CKD-EPI Creatinine Equation (2021)    Anion gap 14 5 - 15    Comment: Performed at [facility], [address] [place], [place] [address]  Ethanol     Status: None   Collection Time: 09/19/20  9:45 AM  Result Value Ref Range   Alcohol, Ethyl (B) <10 <10 mg/dL    Comment: (NOTE) Lowest detectable limit for serum alcohol is 10 mg/dL.  For medical purposes only. Performed at [facility], [address] [place], [place] [address]   Salicylate level     Status: Abnormal   Collection Time: 09/19/20  9:45 AM  Result Value Ref Range   Salicylate Lvl <8.1 (L) 7.0 - 30.0 mg/dL    Comment: Performed at [facility], [address] [place], [place] [address]  Acetaminophen level     Status: Abnormal   Collection Time: 09/19/20  9:45 AM  Result Value Ref Range   Acetaminophen (Tylenol), Serum <10 (L) 10 - 30 ug/mL    Comment: (NOTE) Therapeutic concentrations vary significantly. A range of 10-30 ug/mL  may be an effective concentration for many patients. However, some  are best treated at concentrations outside of this range. Acetaminophen concentrations >150 ug/mL at 4 hours after ingestion  and >50 ug/mL at 12 hours after ingestion are often associated with  toxic reactions.  Performed at [facility], [address] [place], [place] [address]   cbc      Status: Abnormal   Collection Time: 09/19/20  9:45 AM  Result Value Ref Range   WBC 12.7 (H) 4.0 - 10.5 K/uL   RBC 4.29 3.87 - 5.11 MIL/uL   Hemoglobin 13.3 12.0 - 15.0 g/dL   HCT 40.3 36.0 - 46.0 %   MCV 93.9 80.0 - 100.0 fL   MCH 31.0 26.0 - 34.0 pg   MCHC 33.0 30.0 - 36.0 g/dL   RDW 12.4  11.5 - 15.5 %   Platelets 404 (H) 150 - 400 K/uL   nRBC 0.0 0.0 - 0.2 %    Comment: Performed at [facility], [address] [place], [place] [address]  Lipase, blood     Status: None   Collection Time: 09/19/20  9:45 AM  Result Value Ref Range   Lipase 35 11 - 51 U/L    Comment: Performed at [facility], [address] [place], [place] [address]  Urine Drug Screen, Qualitative     Status: Abnormal   Collection Time: 09/19/20 12:14 PM  Result Value Ref Range   Tricyclic, Ur Screen NONE DETECTED NONE DETECTED   Amphetamines, Ur Screen NONE DETECTED NONE DETECTED   MDMA (Ecstasy)Ur Screen NONE DETECTED NONE DETECTED   Cocaine Metabolite,Ur [place] NONE DETECTED NONE DETECTED   Opiate, Ur Screen POSITIVE (A) NONE DETECTED   Phencyclidine (PCP) Ur S NONE DETECTED NONE DETECTED   Cannabinoid 50 Ng, Ur [place] NONE DETECTED NONE DETECTED   Barbiturates, Ur Screen NONE DETECTED NONE DETECTED   Benzodiazepine, Ur Scrn NONE DETECTED NONE DETECTED   Methadone Scn, Ur NONE DETECTED NONE DETECTED    Comment: (NOTE) Tricyclics + metabolites, urine    Cutoff 1000 ng/mL Amphetamines + metabolites, urine  Cutoff 1000 ng/mL MDMA (Ecstasy), urine              Cutoff 500 ng/mL Cocaine Metabolite, urine          Cutoff 300 ng/mL Opiate + metabolites, urine        Cutoff 300 ng/mL Phencyclidine (PCP), urine         Cutoff 25 ng/mL Cannabinoid, urine                 Cutoff 50 ng/mL Barbiturates + metabolites, urine  Cutoff 200 ng/mL Benzodiazepine, urine              Cutoff 200 ng/mL Methadone, urine                   Cutoff 300 ng/mL  The urine drug screen provides only a preliminary,  unconfirmed analytical test result and should not be used for non-medical purposes. Clinical consideration and professional judgment should be applied to any positive drug screen result due to possible interfering substances. A more specific alternate chemical method must be used in order to obtain a confirmed analytical result. Gas chromatography / mass spectrometry (GC/MS) is the preferred confirm atory method. Performed at [facility], [address] [place], [place] [address]   Urinalysis, Complete w Microscopic     Status: Abnormal   Collection Time: 09/19/20  3:01 PM  Result Value Ref Range   Color, Urine YELLOW (A) YELLOW   APPearance HAZY (A) CLEAR   Specific Gravity, Urine 1.010 1.005 - 1.030   pH 5.0 5.0 - 8.0   Glucose, UA NEGATIVE NEGATIVE mg/dL   Hgb urine dipstick MODERATE (A) NEGATIVE   Bilirubin Urine NEGATIVE NEGATIVE   Ketones, ur NEGATIVE NEGATIVE mg/dL   Protein, ur 30 (A) NEGATIVE mg/dL   Nitrite NEGATIVE NEGATIVE   Leukocytes,Ua MODERATE (A) NEGATIVE   RBC / HPF 0-5 0 - 5 RBC/hpf   WBC, UA 6-10 0 - 5 WBC/hpf   Bacteria, UA FEW (A) NONE SEEN   Squamous Epithelial / LPF 0-5 0 - 5   Hyaline Casts, UA PRESENT     Comment: Performed at [facility], [address], [place], [place] [address]  Current Facility-Administered Medications  Medication Dose Route Frequency Provider Last Rate Last Admin  . 0.9 %  sodium chloride infusion  250 mL Intravenous PRN Wouk, Ailene Rud, MD      . apixaban [facility]) tablet 5 mg  5 mg Oral BID Lorna Dibble, RPH      . metoprolol succinate (TOPROL-XL) 24 hr tablet 100 mg  100 mg Oral Daily Wouk, Ailene Rud, MD      . morphine 4 MG/ML injection 4 mg  4 mg Intravenous Q4H PRN Wouk, Ailene Rud, MD      . sodium chloride flush (NS) 0.9 % injection 3 mL  3 mL Intravenous Q12H Wouk, Ailene Rud, MD   3 mL at 09/19/20 1300  . sodium chloride flush (NS) 0.9 % injection 3 mL  3 mL Intravenous PRN Wouk,  Ailene Rud, MD       Current Outpatient Medications  Medication Sig Dispense Refill  . ELIQUIS 5 MG TABS tablet Take 5 mg by mouth 2 (two) times daily.    . metoprolol succinate (TOPROL-XL) 100 MG 24 hr tablet Take 100 mg by mouth daily.    Marland Kitchen oxybutynin (DITROPAN-XL) 10 MG 24 hr tablet Take 10 mg by mouth daily.    . furosemide (LASIX) 20 MG tablet Take 20 mg by mouth daily. (Patient not taking: No sig reported)    . losartan (COZAAR) 25 MG tablet Take 25 mg by mouth daily. (Patient not taking: No sig reported)    . meloxicam (MOBIC) 15 MG tablet Take 15 mg by mouth daily. (Patient not taking: No sig reported)    . venlafaxine XR (EFFEXOR-XR) 37.5 MG 24 hr capsule Take 37.5 mg by mouth daily. (Patient not taking: No sig reported)    . Vitamin D, Ergocalciferol, (DRISDOL) 1.25 MG (50000 UNIT) CAPS capsule Take 50,000 Units by mouth once a week.      Musculoskeletal: Strength & Muscle Tone: within normal limits Gait & Station: normal Patient leans: N/A  Psychiatric Specialty Exam: Physical Exam Constitutional:      Appearance: She is well-developed and well-nourished.  HENT:     Head: Normocephalic and atraumatic.  Eyes:     Conjunctiva/sclera: Conjunctivae normal.     Pupils: Pupils are equal, round, and reactive to light.  Cardiovascular:     Heart sounds: Normal heart sounds.  Pulmonary:     Effort: Pulmonary effort is normal.  Abdominal:     Palpations: Abdomen is soft.  Musculoskeletal:        General: Normal range of motion.     Cervical back: Normal range of motion.  Skin:    General: Skin is warm and dry.  Neurological:     Mental Status: She is alert.  Psychiatric:        Attention and Perception: Attention normal.        Mood and Affect: Mood is depressed.        Speech: Speech normal.        Behavior: Behavior is cooperative.        Thought Content: Thought content includes suicidal ideation. Thought content includes suicidal plan.        Cognition and  Memory: Cognition normal.        Judgment: Judgment is impulsive.     Review of Systems  Constitutional: Negative.   HENT: Negative.   Eyes: Negative.   Respiratory: Negative.   Cardiovascular: Negative.   Gastrointestinal: Positive for abdominal pain.  Musculoskeletal: Negative.   Skin: Negative.  Neurological: Negative.   Psychiatric/Behavioral: Positive for dysphoric mood and suicidal ideas.    Blood pressure (!) 138/98, pulse (!) 155, temperature 98.4 F (36.9 C), resp. rate 18, height 5' 9.5" (1.765 m), weight 72.6 kg, SpO2 100 %.Body mass index is 23.29 kg/m.  General Appearance: Casual  Eye Contact:  Fair  Speech:  Slow  Volume:  Decreased  Mood:  Depressed  Affect:  Constricted  Thought Process:  Goal Directed  Orientation:  Full (Time, Place, and Person)  Thought Content:  Logical  Suicidal Thoughts:  Yes.  without intent/plan  Homicidal Thoughts:  No  Memory:  Immediate;   Fair Recent;   Fair Remote;   Fair  Judgement:  Impaired  Insight:  Shallow  Psychomotor Activity:  Decreased  Concentration:  Concentration: Poor  Recall:  [employer] of Knowledge:  Fair  Language:  Fair  Akathisia:  No  Handed:  Right  AIMS (if indicated):     Assets:  Desire for Improvement Housing  ADL's:  Impaired  Cognition:  Impaired,  Mild  Sleep:        Treatment Plan Summary: Medication management and Plan 75 year old woman who comes in with primarily somatic complaints of abdominal pain but who is very depressed and it is very easy to get her talking about being suicidal sad disappointed with her life disliking her husband etc.  Not clear what the cause of the abdominal pain is but I suspect she may have a real depression as well.  She is very clear that she is not going to harm her self in the hospital.  Does not require a sitter or suicide precautions.  I told her I would like to start her on mirtazapine with a plan to titrate up to an effective dose.  Patient is  agreeable.  I will continue to follow-up when she is admitted.  Disposition: Supportive therapy provided about ongoing stressors. Continue to follow-up with treatment of depression  Alethia Berthold, MD 09/19/2020 4:27 PM

## 2020-09-19 NOTE — ED Triage Notes (Signed)
First RN Note: Pt to ED via wheelchair from [facility], this RN received phone call from pt's PCP for report. Per pt's PCP pt seen 1 week ago for LLQ abdominal pain and placed on Augmentin for possible diverticulitis, per PCP pt also with noted elevated creatinine, per PCP pt opted for oral rehydration at home, returned to PCP today with c/o 10/10 LLQ abdominal pain. Per PCP pt made statement that she would kill herself if they sent her home.

## 2020-09-19 NOTE — ED Notes (Addendum)
[facility] notified of pt's continuous afib w/ RVR. Pt remains asymptomatic, denies CP, denies SHOB. HR ranges from 125-140. BP 135/98 at this time. See verbal order for IV lopressor 5mg , repeat in 66min if HR sustains afib RVR/ does not improve. Verbal order placed.

## 2020-09-19 NOTE — H&P (Addendum)
History and Physical    Gloria Gill KZS:[id number] DOB: [birth year] DOA: 09/19/2020  PCP: [name], MD  Patient coming from: home   Chief Complaint: abd pain  HPI: Gloria Gill is a 75 y.o. female with medical history significant for etoh (sober 3 years), a fib on eliquis, etoh cardiomyopathy, who presents with the above.  Few weeks of crampy suprapubic pain. Seen by pcp a week or so ago, clinical dx diverticulitis, started on augmentin. Didn't improved. Labs at f/u showed aki so referred to ED. Patient reports she was started on oxybutynin 3 weeks ago for urinary incontinence. No diarrhea or vomiting. No chest pain. No dysuria or fevers. Is making urine but thinks urinating less. No history kidney stones. Denies hematuria.  Told ED provider she's thinking of harming herself. Endorses SI and says plan is to drive into something with her car. 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 Denies alcohol or drug use.  ED Course:   Labs, imaging  Review of Systems: As per HPI otherwise 10 point review of systems negative.    Past Medical History:  Diagnosis Date  . Atrial fibrillation ([place])   . Hypertension     Past Surgical History:  Procedure Laterality Date  . APPENDECTOMY    . BREAST SURGERY    . REDUCTION MAMMAPLASTY Bilateral 1981   pt stated she had implants with these scars/had implants removed 2009     reports that she has never smoked. She has never used smokeless tobacco. She reports previous alcohol use. She reports that she does not use drugs.  Allergies  Allergen Reactions  . Lisinopril Nausea Only    Family History  Problem Relation Age of Onset  . Breast cancer Neg Hx     Prior to Admission medications   Medication Sig Start Date End Date Taking? Authorizing Provider  ELIQUIS 5 MG TABS tablet Take 5 mg by mouth 2 (two) times daily. 08/31/20  Yes [provider]  metoprolol succinate (TOPROL-XL) 100 MG  24 hr tablet Take 100 mg by mouth daily. 09/08/20  Yes [provider]  oxybutynin (DITROPAN-XL) 10 MG 24 hr tablet Take 10 mg by mouth daily. 08/04/20  Yes [provider]  furosemide (LASIX) 20 MG tablet Take 20 mg by mouth daily. Patient not taking: No sig reported 07/26/20   [provider]  losartan (COZAAR) 25 MG tablet Take 25 mg by mouth daily. Patient not taking: No sig reported 07/26/20   [provider]  meloxicam (MOBIC) 15 MG tablet Take 15 mg by mouth daily. Patient not taking: No sig reported 05/22/20   [provider]  venlafaxine XR (EFFEXOR-XR) 37.5 MG 24 hr capsule Take 37.5 mg by mouth daily. Patient not taking: No sig reported 09/03/20   [provider]  Vitamin D, Ergocalciferol, (DRISDOL) 1.25 MG (50000 UNIT) CAPS capsule Take 50,000 Units by mouth once a week. 07/27/20   [provider]    Physical Exam: Vitals:   09/19/20 0941 09/19/20 0948  BP: (!) 137/114   Pulse: (!) 53   Resp: 18   Temp: 98.4 F (36.9 C)   SpO2: 100%   Weight:  72.6 kg  Height:  5' 9.5" (1.765 m)    Constitutional: No acute distress Head: Atraumatic Eyes: Conjunctiva clear ENM: Moist mucous membranes. Normal dentition.  Neck: Supple Respiratory: Clear to auscultation bilaterally, no wheezing/rales/rhonchi. Normal respiratory effort. No accessory muscle use. . Cardiovascular: Regular rate and rhythm. No  murmurs/rubs/gallops. Abdomen: Non-tender, non-distended. No masses. No rebound or guarding. Positive bowel sounds. Musculoskeletal: No joint deformity upper and lower extremities. Normal ROM, no contractures. Normal muscle tone.  Skin: No rashes, lesions, or ulcers.  Extremities: No peripheral edema. Palpable peripheral pulses. Neurologic: Alert, moving all 4 extremities. Psychiatric: Normal insight and judgement.   Labs on Admission: I have personally reviewed following labs and imaging studies  CBC: Recent Labs  Lab  09/19/20 0945  WBC 12.7*  HGB 13.3  HCT 40.3  MCV 93.9  PLT 176*   Basic Metabolic Panel: Recent Labs  Lab 09/19/20 0945  NA 138  K 4.1  CL 104  CO2 20*  GLUCOSE 210*  BUN 33*  CREATININE 3.08*  CALCIUM 9.2   GFR: Estimated Creatinine Clearance: 16.5 mL/min (A) (by C-G formula based on SCr of 3.08 mg/dL (H)). Liver Function Tests: Recent Labs  Lab 09/19/20 0945  AST 25  ALT 21  ALKPHOS 49  BILITOT 0.8  PROT 7.2  ALBUMIN 3.8   Recent Labs  Lab 09/19/20 0945  LIPASE 35   No results for input(s): AMMONIA in the last 168 hours. Coagulation Profile: No results for input(s): INR, PROTIME in the last 168 hours. Cardiac Enzymes: No results for input(s): CKTOTAL, CKMB, CKMBINDEX, TROPONINI in the last 168 hours. BNP (last 3 results) No results for input(s): PROBNP in the last 8760 hours. HbA1C: No results for input(s): HGBA1C in the last 72 hours. CBG: No results for input(s): GLUCAP in the last 168 hours. Lipid Profile: No results for input(s): CHOL, HDL, LDLCALC, TRIG, CHOLHDL, LDLDIRECT in the last 72 hours. Thyroid Function Tests: No results for input(s): TSH, T4TOTAL, FREET4, T3FREE, THYROIDAB in the last 72 hours. Anemia Panel: No results for input(s): VITAMINB12, FOLATE, FERRITIN, TIBC, IRON, RETICCTPCT in the last 72 hours. Urine analysis: No results found for: COLORURINE, APPEARANCEUR, [place], [place], [place], HGBUR, BILIRUBINUR, KETONESUR, PROTEINUR, UROBILINOGEN, NITRITE, LEUKOCYTESUR  Radiological Exams on Admission: CT ABDOMEN PELVIS WO CONTRAST  Result Date: 09/19/2020 CLINICAL DATA:  Left lower quadrant pain. EXAM: CT ABDOMEN AND PELVIS WITHOUT CONTRAST TECHNIQUE: Multidetector CT imaging of the abdomen and pelvis was performed following the standard protocol without IV contrast. COMPARISON:  03/20/2005 FINDINGS: Lower chest: Small right pleural effusion.  No confluent opacities. Hepatobiliary: No focal hepatic abnormality. Gallbladder  unremarkable. Pancreas: No focal abnormality or ductal dilatation. Spleen: No focal abnormality.  Normal size. Adrenals/Urinary Tract: Bilateral hydronephrosis. No renal or ureteral stones. The ureters are dilated to the urinary bladder which is decompressed. Adrenal glands unremarkable. Stomach/Bowel: Colonic diverticulosis diffusely. No active diverticulitis. Stomach and small bowel decompressed, unremarkable. Vascular/Lymphatic: Aortic atherosclerosis. No evidence of aneurysm or adenopathy. Reproductive: Uterus and adnexa unremarkable.  No mass. Other: Free fluid noted in the pelvis anterior to the uterus. No free air. Musculoskeletal: No acute bony abnormality. IMPRESSION: Diffuse colonic diverticulosis.  No active diverticulitis. Bilateral hydronephrosis of unknown etiology. Ureters are dilated to the bladder. No visible stones. Bladder is decompressed. Small amount of free fluid in the pelvis. Aortic atherosclerosis. Small right pleural effusion. Electronically Signed   By: Rolm Baptise M.D.   On: 09/19/2020 10:44     Assessment/Plan Active Problems:   Alcohol abuse   Cardiomyopathy ([place])   AKI (acute kidney injury) ([place])   HFrEF (heart failure with reduced ejection fraction) ([place])   Hydronephrosis   Suicidal ideation   # AKI # Bilateral hydronephrosis This appears to be a post-renal AKI. Cr 3 from normal previously. . CT does not show cause  of obstruction. As started oxybutynin 3 weeks ago and aki noted shortly after that, suspect this may be secondary to urinary retention. 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 Per cardiology record most recent EF 40%. Appears compensated - hold lasix and losartan as above - cont home  metoprolol  # Paroxysmal a fib - cont home eliquis, [place] dose per pharmacy - cont home metop  DVT prophylaxis: heparin Code Status: full  Family Communication: none @ bedside  Consults called: urology, psychiatry  Level of care: Med-Surg Status is: Inpatient  Remains inpatient appropriate because:Inpatient level of care appropriate due to severity of illness   Dispo: The patient is from: Home              Anticipated d/c is to: Home              Patient currently is not medically stable to d/c.   Difficult to place patient No        Desma Maxim MD [facility] Pager [phone]  If 7PM-7AM, please contact night-coverage [email or web address] Password [facility]  09/19/2020, 2:58 PM

## 2020-09-19 NOTE — ED Notes (Signed)
Pt to CT

## 2020-09-19 NOTE — ED Provider Notes (Signed)
[facility] Emergency Department Provider Note   ____________________________________________    I have reviewed the triage vital signs and the nursing notes.   HISTORY  Chief Complaint Abdominal Pain and SI     HPI Gloria Gill is a 75 y.o. female who presents with complaints of lower abdominal pain.  Patient describes 2 weeks of pain.  She saw her PCP today for reevaluation after being treated with antibiotics for presumed diverticulitis with no improvement.  Made SI comments related to the pain if discharged home so sent to the emergency department.  Complains of cramping and sharp pain in the lower abdomen, left greater than right.  No dysuria.  No hematuria reported.  History of an appendectomy.  No fevers or chills, decreased p.o. intake, feels dehydrated.  Past Medical History:  Diagnosis Date  . Atrial fibrillation ([place])   . Hypertension     Patient Active Problem List   Diagnosis Date Noted  . Alcohol abuse 05/06/2018  . Cardiomyopathy ([place]) 05/06/2018  . Atrial fibrillation ([place]) 12/08/2017    Past Surgical History:  Procedure Laterality Date  . APPENDECTOMY    . BREAST SURGERY    . REDUCTION MAMMAPLASTY Bilateral 1981   pt stated she had implants with these scars/had implants removed 2009    Prior to Admission medications   Medication Sig Start Date End Date Taking? Authorizing Provider  albuterol (PROVENTIL HFA;VENTOLIN HFA) 108 (90 Base) MCG/ACT inhaler Inhale 1-2 puffs into the lungs every 6 (six) hours as needed. 11/30/17   [provider]  azithromycin (ZITHROMAX) 250 MG tablet Take 2 tablets (500MG ) by mouth first day then take 1 tablet (250MG ) by mouth daily for next 4 days 11/30/17   [provider]  benzonatate (TESSALON) 200 MG capsule Take 1 capsule by mouth 3 (three) times daily as needed. 11/30/17   [provider]  diltiazem (CARDIZEM CD) 120 MG 24 hr capsule Take 1 capsule (120 mg  total) by mouth daily. 12/02/17 12/02/18  Harvest Dark, MD     Allergies Patient has no known allergies.  Family History  Problem Relation Age of Onset  . Breast cancer Neg Hx     Social History Social History   Tobacco Use  . Smoking status: Never Smoker  . Smokeless tobacco: Never Used  Substance Use Topics  . Alcohol use: Not Currently  . Drug use: Never    Review of Systems  Constitutional: No fever/chills Eyes: No visual changes.  ENT: No sore throat. Cardiovascular: Denies chest pain. Respiratory: Denies shortness of breath. Gastrointestinal: As above Genitourinary: Negative for dysuria. Musculoskeletal: Negative for back pain. Skin: Negative for rash. Neurological: Negative for headaches    ____________________________________________   PHYSICAL EXAM:  VITAL SIGNS: ED Triage Vitals  Enc Vitals Group     BP 09/19/20 0941 (!) 137/114     Pulse Rate 09/19/20 0941 (!) 53     Resp 09/19/20 0941 18     Temp 09/19/20 0941 98.4 F (36.9 C)     Temp src --      SpO2 09/19/20 0941 100 %     Weight 09/19/20 0948 72.6 kg (160 lb)     Height 09/19/20 0948 1.765 m (5' 9.5")     Head Circumference --      Peak Flow --      Pain Score 09/19/20 0941 10     Pain Loc --      Pain Edu? --  Excl. in [place]? --     Constitutional: Alert and oriented.   Nose: No congestion/rhinnorhea. Mouth/Throat: Mucous membranes are moist.   Neck:  Painless ROM Cardiovascular: Normal rate, regular rhythm. Grossly normal heart sounds.  Good peripheral circulation. Respiratory: Normal respiratory effort.  No retractions. Lungs CTAB. Gastrointestinal: Soft, mild tenderness left lower quadrant.  No distention.  No CVA tenderness.  Musculoskeletal: No lower extremity tenderness nor edema.  Warm and well perfused Neurologic:  Normal speech and language. No gross focal neurologic deficits are appreciated.  Skin:  Skin is warm, dry and intact. No rash noted. Psychiatric: Mood  and affect are normal.  Patient clearly frustrated with pain, no clear plan to hurt herself but does express SI, she agrees to stay in the emergency department for evaluation  ____________________________________________   LABS (all labs ordered are listed, but only abnormal results are displayed)  Labs Reviewed  COMPREHENSIVE METABOLIC PANEL - Abnormal; Notable for the following components:      Result Value   CO2 20 (*)    Glucose, Bld 210 (*)    BUN 33 (*)    Creatinine, Ser 3.08 (*)    GFR, Estimated 15 (*)    All other components within normal limits  SALICYLATE LEVEL - Abnormal; Notable for the following components:   Salicylate Lvl <4.8 (*)    All other components within normal limits  ACETAMINOPHEN LEVEL - Abnormal; Notable for the following components:   Acetaminophen (Tylenol), Serum <10 (*)    All other components within normal limits  CBC - Abnormal; Notable for the following components:   WBC 12.7 (*)    Platelets 404 (*)    All other components within normal limits  ETHANOL  LIPASE, BLOOD  URINE DRUG SCREEN, QUALITATIVE ([facility] ONLY)   ____________________________________________  EKG None ____________________________________________  RADIOLOGY  CT abdomen pelvis without contrast ____________________________________________   PROCEDURES  Procedure(s) performed: No  Procedures   Critical Care performed: No ____________________________________________   INITIAL IMPRESSION / ASSESSMENT AND PLAN / ED COURSE  Pertinent labs & imaging results that were available during my care of the patient were reviewed by me and considered in my medical decision making (see chart for details).  Patient with lower abdominal pain as described above.  Mild tenderness in the left lower quadrant, certainly suspicious for diverticulitis, failed outpatient management.  Review of outside records demonstrates the patient's creatinine has also increased significantly from  0.9-3.2 as measured outpatient today consistent with acute kidney injury  We will give IV morphine, IV fluids, IV Zofran, obtain CT abdomen pelvis without contrast to evaluate further.  Patient contracts for safety here in the emergency department, will not IVC at this time.  Have consulted psychiatry  Please urology consult on request of the hospitalist    ____________________________________________   FINAL CLINICAL IMPRESSION(S) / ED DIAGNOSES  Final diagnoses:  Acute kidney injury ([place])  Hydronephrosis, unspecified hydronephrosis type        Note:  This document was prepared using Dragon voice recognition software and may include unintentional dictation errors.   Lavonia Drafts, MD 09/19/20 [phone]

## 2020-09-19 NOTE — ED Notes (Addendum)
Called lab to see if urine culture can be added on to previous collection. Spoke with [employer]. Urine culture will be run off previous collection.

## 2020-09-19 NOTE — ED Notes (Addendum)
Pt lying awake in bed -- calm and quiet-- no complaints, questions, concerns verbalized at this time.  Indwelling foley patent draining pink tinged urine via gravity.  Cardiac monitoring maintained- uncontrolled A-Fib HR 118 (per receiving nurse pt earlier in A-Fib RVR 160s).   Will monitor for acute changes and maintain plan of care

## 2020-09-20 ENCOUNTER — Other Ambulatory Visit: Payer: Self-pay | Admitting: Radiology

## 2020-09-20 DIAGNOSIS — I4891 Unspecified atrial fibrillation: Secondary | ICD-10-CM | POA: Diagnosis present

## 2020-09-20 DIAGNOSIS — F332 Major depressive disorder, recurrent severe without psychotic features: Secondary | ICD-10-CM | POA: Diagnosis not present

## 2020-09-20 LAB — BASIC METABOLIC PANEL
Anion gap: 9 (ref 5–15)
BUN: 30 mg/dL — ABNORMAL HIGH (ref 8–23)
CO2: 25 mmol/L (ref 22–32)
Calcium: 9.1 mg/dL (ref 8.9–10.3)
Chloride: 107 mmol/L (ref 98–111)
Creatinine, Ser: 2.97 mg/dL — ABNORMAL HIGH (ref 0.44–1.00)
GFR, Estimated: 16 mL/min — ABNORMAL LOW (ref >60)
Glucose, Bld: 104 mg/dL — ABNORMAL HIGH (ref 70–99)
Potassium: 4.8 mmol/L (ref 3.5–5.1)
Sodium: 141 mmol/L (ref 135–145)

## 2020-09-20 LAB — CBC
HCT: 38.7 % (ref 36.0–46.0)
Hemoglobin: 12.3 g/dL (ref 12.0–15.0)
MCH: 30.9 pg (ref 26.0–34.0)
MCHC: 31.8 g/dL (ref 30.0–36.0)
MCV: 97.2 fL (ref 80.0–100.0)
Platelets: 343 10*3/uL (ref 150–400)
RBC: 3.98 MIL/uL (ref 3.87–5.11)
RDW: 12.4 % (ref 11.5–15.5)
WBC: 10.3 10*3/uL (ref 4.0–10.5)
nRBC: 0.2 % (ref 0.0–0.2)

## 2020-09-20 LAB — APTT
aPTT: 33 seconds (ref 24–36)
aPTT: 122 seconds — ABNORMAL HIGH (ref 24–36)

## 2020-09-20 MED ORDER — ONDANSETRON HCL 4 MG/2ML IJ SOLN: 4.0000 mg | INTRAMUSCULAR | Status: DC | PRN

## 2020-09-20 MED ORDER — ONDANSETRON HCL 4 MG/2ML IJ SOLN
INTRAMUSCULAR | Status: AC
  Administered 2020-09-20: 4 mg via INTRAVENOUS
  Filled 2020-09-20: qty 2

## 2020-09-20 MED ORDER — MIRTAZAPINE 15 MG PO TABS
15.0000 mg | ORAL_TABLET | Freq: Every day | ORAL | Status: DC
  Administered 2020-09-20 – 2020-09-26 (×7): 15 mg via ORAL
  Filled 2020-09-20 (×7): qty 1

## 2020-09-20 MED ORDER — DILTIAZEM LOAD VIA INFUSION
5.0000 mg | Freq: Once | INTRAVENOUS | Status: AC
  Administered 2020-09-20: 5 mg via INTRAVENOUS

## 2020-09-20 MED ORDER — HEPARIN (PORCINE) 25000 UT/250ML-% IV SOLN
950.0000 [IU]/h | INTRAVENOUS | Status: AC
  Administered 2020-09-20: 1000 [IU]/h via INTRAVENOUS
  Administered 2020-09-21 – 2020-09-22 (×2): 950 [IU]/h via INTRAVENOUS
  Administered 2020-09-23: 1200 [IU]/h via INTRAVENOUS
  Filled 2020-09-20 (×5): qty 250

## 2020-09-20 MED ORDER — DILTIAZEM HCL-DEXTROSE 125-5 MG/125ML-% IV SOLN (PREMIX)
5.0000 mg/h | INTRAVENOUS | Status: DC
  Administered 2020-09-20 (×2): 5 mg/h via INTRAVENOUS
  Filled 2020-09-20 (×2): qty 125

## 2020-09-20 MED ORDER — HEPARIN BOLUS VIA INFUSION
4000.0000 [IU] | Freq: Once | INTRAVENOUS | Status: AC
  Administered 2020-09-20: 4000 [IU] via INTRAVENOUS
  Filled 2020-09-20: qty 4000

## 2020-09-20 NOTE — Consult Note (Signed)
[facility] Face-to-Face Psychiatry Consult   Reason for Consult: Consult follow-up 75 year old woman with abdominal pain and depression Referring Physician:  [facility] Patient Identification: Gloria Gill MRN:  [id number] Principal Diagnosis: Severe recurrent major depression without psychotic features ([place]) Diagnosis:  Principal Problem:   Severe recurrent major depression without psychotic features ([place]) Active Problems:   Alcohol abuse   Cardiomyopathy ([place])   AKI (acute kidney injury) ([place])   HFrEF (heart failure with reduced ejection fraction) ([place])   Hydronephrosis   Suicidal ideation   Somatic symptom disorder   Atrial fibrillation with rapid ventricular response ([place])   Total Time spent with patient: 30 minutes  Subjective:   Gloria Gill is a 75 y.o. female patient admitted with "I am feeling better".  HPI: Patient seen.  Compared to yesterday she is feeling better physically.  Abdominal pain much improved.  On the other hand she says she is feeling "more depressed" today although she is not reporting active suicidal ideation.  Seems much calmer and more cheerful and interactive  Past Psychiatric History: See previous.  Risk to Self:   Risk to Others:   Prior Inpatient Therapy:   Prior Outpatient Therapy:    Past Medical History:  Past Medical History:  Diagnosis Date  . Atrial fibrillation ([place])   . Hypertension     Past Surgical History:  Procedure Laterality Date  . APPENDECTOMY    . BREAST SURGERY    . REDUCTION MAMMAPLASTY Bilateral 1981   pt stated she had implants with these scars/had implants removed 2009   Family History:  Family History  Problem Relation Age of Onset  . Breast cancer Neg Hx    Family Psychiatric  History: See previous Social History:  Social History   Substance and Sexual Activity  Alcohol Use Not Currently     Social History   Substance and Sexual Activity  Drug Use Never    Social History   Socioeconomic  History  . Marital status: Married    Spouse name: Not on file  . Number of children: Not on file  . Years of education: Not on file  . Highest education level: Not on file  Occupational History  . Not on file  Tobacco Use  . Smoking status: Never Smoker  . Smokeless tobacco: Never Used  Substance and Sexual Activity  . Alcohol use: Not Currently  . Drug use: Never  . Sexual activity: Not on file  Other Topics Concern  . Not on file  Social History Narrative  . Not on file   Social Determinants of Health   Financial Resource Strain: Not on file  Food Insecurity: Not on file  Transportation Needs: Not on file  Physical Activity: Not on file  Stress: Not on file  Social Connections: Not on file   Additional Social History:    Allergies:   Allergies  Allergen Reactions  . Lisinopril Nausea Only    Labs:  Results for orders placed or performed during the hospital encounter of 09/19/20 (from the past 48 hour(s))  Comprehensive metabolic panel     Status: Abnormal   Collection Time: 09/19/20  9:45 AM  Result Value Ref Range   Sodium 138 135 - 145 mmol/L   Potassium 4.1 3.5 - 5.1 mmol/L   Chloride 104 98 - 111 mmol/L   CO2 20 (L) 22 - 32 mmol/L   Glucose, Bld 210 (H) 70 - 99 mg/dL    Comment: Glucose reference range applies only to samples taken  after fasting for at least 8 hours.   BUN 33 (H) 8 - 23 mg/dL   Creatinine, Ser 3.08 (H) 0.44 - 1.00 mg/dL   Calcium 9.2 8.9 - 10.3 mg/dL   Total Protein 7.2 6.5 - 8.1 g/dL   Albumin 3.8 3.5 - 5.0 g/dL   AST 25 15 - 41 U/L   ALT 21 0 - 44 U/L   Alkaline Phosphatase 49 38 - 126 U/L   Total Bilirubin 0.8 0.3 - 1.2 mg/dL   GFR, Estimated 15 (L) >60 mL/min    Comment: (NOTE) Calculated using the CKD-EPI Creatinine Equation (2021)    Anion gap 14 5 - 15    Comment: Performed at [facility], [address] [place], [place] [address]  Ethanol     Status: None   Collection Time: 09/19/20  9:45 AM  Result Value  Ref Range   Alcohol, Ethyl (B) <10 <10 mg/dL    Comment: (NOTE) Lowest detectable limit for serum alcohol is 10 mg/dL.  For medical purposes only. Performed at [facility], [address] [place], [place] [address]   Salicylate level     Status: Abnormal   Collection Time: 09/19/20  9:45 AM  Result Value Ref Range   Salicylate Lvl <2.5 (L) 7.0 - 30.0 mg/dL    Comment: Performed at [facility], [address] [place], [place] [address]  Acetaminophen level     Status: Abnormal   Collection Time: 09/19/20  9:45 AM  Result Value Ref Range   Acetaminophen (Tylenol), Serum <10 (L) 10 - 30 ug/mL    Comment: (NOTE) Therapeutic concentrations vary significantly. A range of 10-30 ug/mL  may be an effective concentration for many patients. However, some  are best treated at concentrations outside of this range. Acetaminophen concentrations >150 ug/mL at 4 hours after ingestion  and >50 ug/mL at 12 hours after ingestion are often associated with  toxic reactions.  Performed at [facility], [address] [place], [place] [address]   cbc     Status: Abnormal   Collection Time: 09/19/20  9:45 AM  Result Value Ref Range   WBC 12.7 (H) 4.0 - 10.5 K/uL   RBC 4.29 3.87 - 5.11 MIL/uL   Hemoglobin 13.3 12.0 - 15.0 g/dL   HCT 40.3 36.0 - 46.0 %   MCV 93.9 80.0 - 100.0 fL   MCH 31.0 26.0 - 34.0 pg   MCHC 33.0 30.0 - 36.0 g/dL   RDW 12.4 11.5 - 15.5 %   Platelets 404 (H) 150 - 400 K/uL   nRBC 0.0 0.0 - 0.2 %    Comment: Performed at [facility], [address] [place], [place] [address]  Lipase, blood     Status: None   Collection Time: 09/19/20  9:45 AM  Result Value Ref Range   Lipase 35 11 - 51 U/L    Comment: Performed at [facility], [place], [place] [address]  SARS CORONAVIRUS 2 (TAT 6-24 HRS) Nasopharyngeal Nasopharyngeal Swab     Status: None   Collection Time: 09/19/20 12:08 PM   Specimen: Nasopharyngeal  Swab  Result Value Ref Range   SARS Coronavirus 2 NEGATIVE NEGATIVE    Comment: (NOTE) SARS-CoV-2 target nucleic acids are NOT DETECTED.  The SARS-CoV-2 RNA is generally detectable in upper and lower respiratory specimens during the acute phase of infection. Negative results do not preclude SARS-CoV-2 infection, do not rule out co-infections with other pathogens, and  should not be used as the sole basis for treatment or other patient management decisions. Negative results must be combined with clinical observations, patient history, and epidemiological information. The expected result is Negative.  Fact Sheet for Patients: SugarRoll.be  Fact Sheet for Healthcare Providers: https://www.woods-mathews.com/  This test is not yet approved or cleared by the [place] FDA and  has been authorized for detection and/or diagnosis of SARS-CoV-2 by FDA under an Emergency Use Authorization (EUA). This EUA will remain  in effect (meaning this test can be used) for the duration of the COVID-19 declaration under Se ction 564(b)(1) of the Act, 21 U.S.C. section 360bbb-3(b)(1), unless the authorization is terminated or revoked sooner.  Performed at [facility], [place] [address], [place], [place] [address]   Urine Drug Screen, Qualitative     Status: Abnormal   Collection Time: 09/19/20 12:14 PM  Result Value Ref Range   Tricyclic, Ur Screen NONE DETECTED NONE DETECTED   Amphetamines, Ur Screen NONE DETECTED NONE DETECTED   MDMA (Ecstasy)Ur Screen NONE DETECTED NONE DETECTED   Cocaine Metabolite,Ur [place] NONE DETECTED NONE DETECTED   Opiate, Ur Screen POSITIVE (A) NONE DETECTED   Phencyclidine (PCP) Ur S NONE DETECTED NONE DETECTED   Cannabinoid 50 Ng, Ur [place] NONE DETECTED NONE DETECTED   Barbiturates, Ur Screen NONE DETECTED NONE DETECTED   Benzodiazepine, Ur Scrn NONE DETECTED NONE DETECTED   Methadone Scn, Ur NONE DETECTED NONE DETECTED     Comment: (NOTE) Tricyclics + metabolites, urine    Cutoff 1000 ng/mL Amphetamines + metabolites, urine  Cutoff 1000 ng/mL MDMA (Ecstasy), urine              Cutoff 500 ng/mL Cocaine Metabolite, urine          Cutoff 300 ng/mL Opiate + metabolites, urine        Cutoff 300 ng/mL Phencyclidine (PCP), urine         Cutoff 25 ng/mL Cannabinoid, urine                 Cutoff 50 ng/mL Barbiturates + metabolites, urine  Cutoff 200 ng/mL Benzodiazepine, urine              Cutoff 200 ng/mL Methadone, urine                   Cutoff 300 ng/mL  The urine drug screen provides only a preliminary, unconfirmed analytical test result and should not be used for non-medical purposes. Clinical consideration and professional judgment should be applied to any positive drug screen result due to possible interfering substances. A more specific alternate chemical method must be used in order to obtain a confirmed analytical result. Gas chromatography / mass spectrometry (GC/MS) is the preferred confirm atory method. Performed at [facility], [address] [place], [place] [address]   Urinalysis, Complete w Microscopic     Status: Abnormal   Collection Time: 09/19/20  3:01 PM  Result Value Ref Range   Color, Urine YELLOW (A) YELLOW   APPearance HAZY (A) CLEAR   Specific Gravity, Urine 1.010 1.005 - 1.030   pH 5.0 5.0 - 8.0   Glucose, UA NEGATIVE NEGATIVE mg/dL   Hgb urine dipstick MODERATE (A) NEGATIVE   Bilirubin Urine NEGATIVE NEGATIVE   Ketones, ur NEGATIVE NEGATIVE mg/dL   Protein, ur 30 (A) NEGATIVE mg/dL   Nitrite NEGATIVE NEGATIVE   Leukocytes,Ua MODERATE (A) NEGATIVE   RBC / HPF 0-5 0 - 5 RBC/hpf   WBC,  UA 6-10 0 - 5 WBC/hpf   Bacteria, UA FEW (A) NONE SEEN   Squamous Epithelial / LPF 0-5 0 - 5   Hyaline Casts, UA PRESENT     Comment: Performed at [facility], [address], [place], [place] [address]  Basic metabolic panel     Status: Abnormal   Collection Time:  09/20/20  4:07 AM  Result Value Ref Range   Sodium 141 135 - 145 mmol/L   Potassium 4.8 3.5 - 5.1 mmol/L   Chloride 107 98 - 111 mmol/L   CO2 25 22 - 32 mmol/L   Glucose, Bld 104 (H) 70 - 99 mg/dL    Comment: Glucose reference range applies only to samples taken after fasting for at least 8 hours.   BUN 30 (H) 8 - 23 mg/dL   Creatinine, Ser 2.97 (H) 0.44 - 1.00 mg/dL   Calcium 9.1 8.9 - 10.3 mg/dL   GFR, Estimated 16 (L) >60 mL/min    Comment: (NOTE) Calculated using the CKD-EPI Creatinine Equation (2021)    Anion gap 9 5 - 15    Comment: Performed at [facility], [address] [place], [place] [address]  CBC     Status: None   Collection Time: 09/20/20  4:07 AM  Result Value Ref Range   WBC 10.3 4.0 - 10.5 K/uL   RBC 3.98 3.87 - 5.11 MIL/uL   Hemoglobin 12.3 12.0 - 15.0 g/dL   HCT 38.7 36.0 - 46.0 %   MCV 97.2 80.0 - 100.0 fL   MCH 30.9 26.0 - 34.0 pg   MCHC 31.8 30.0 - 36.0 g/dL   RDW 12.4 11.5 - 15.5 %   Platelets 343 150 - 400 K/uL   nRBC 0.2 0.0 - 0.2 %    Comment: Performed at [facility], [address] [place], [place] [address]  APTT     Status: None   Collection Time: 09/20/20 12:10 PM  Result Value Ref Range   aPTT 33 24 - 36 seconds    Comment: Performed at [facility], [address] [place],  [address]    Current Facility-Administered Medications  Medication Dose Route Frequency Provider Last Rate Last Admin  . 0.9 %  sodium chloride infusion  250 mL Intravenous PRN Wouk, Ailene Rud, MD      . diltiazem (CARDIZEM) 125 mg in dextrose 5% 125 mL (1 mg/mL) infusion  5-15 mg/hr Intravenous Titrated Mansy, Jan A, MD 5 mL/hr at 09/20/20 1452 5 mg/hr at 09/20/20 1452  . heparin ADULT infusion 100 units/mL (25000 units/252mL)  1,000 Units/hr Intravenous Continuous Dallie Piles, RPH 10 mL/hr at 09/20/20 1223 1,000 Units/hr at 09/20/20 1223  . metoprolol succinate (TOPROL-XL) 24 hr tablet 100 mg  100 mg Oral Daily Gwynne Edinger, MD   100 mg at 09/20/20 0905  . mirtazapine (REMERON) tablet 15 mg  15 mg Oral QHS Wouk, Ailene Rud, MD      . morphine 4 MG/ML injection 4 mg  4 mg Intravenous Q4H PRN Wouk, Ailene Rud, MD   4 mg at 09/20/20 0448  . ondansetron (ZOFRAN) injection 4 mg  4 mg Intravenous Q4H PRN Mansy, Jan A, MD   4 mg at 09/20/20 0108  . sodium chloride flush (NS) 0.9 % injection 3 mL  3 mL Intravenous Q12H Wouk, Ailene Rud, MD   3 mL at 09/20/20 0857  . sodium chloride flush (NS) 0.9 % injection 3 mL  3 mL  Intravenous PRN Wouk, Ailene Rud, MD      . traZODone (DESYREL) tablet 25 mg  25 mg Oral QHS PRN Mansy, Jan A, MD   25 mg at 09/19/20 2153    Musculoskeletal: Strength & Muscle Tone: within normal limits Gait & Station: normal Patient leans: N/A  Psychiatric Specialty Exam: Physical Exam Vitals and nursing note reviewed.  Constitutional:      Appearance: She is well-developed and well-nourished.  HENT:     Head: Normocephalic and atraumatic.  Eyes:     Conjunctiva/sclera: Conjunctivae normal.     Pupils: Pupils are equal, round, and reactive to light.  Cardiovascular:     Heart sounds: Normal heart sounds.  Pulmonary:     Effort: Pulmonary effort is normal.  Abdominal:     Palpations: Abdomen is soft.  Musculoskeletal:        General: Normal range of motion.     Cervical back: Normal range of motion.  Skin:    General: Skin is warm and dry.  Neurological:     Mental Status: She is alert.  Psychiatric:        Attention and Perception: Attention normal.        Mood and Affect: Mood is depressed.        Speech: Speech normal.        Behavior: Behavior is cooperative.        Thought Content: Thought content does not include suicidal ideation.        Cognition and Memory: Cognition normal.        Judgment: Judgment normal.     Review of Systems  Constitutional: Negative.   HENT: Negative.   Eyes: Negative.   Respiratory: Negative.   Cardiovascular: Negative.    Gastrointestinal: Negative.   Musculoskeletal: Negative.   Skin: Negative.   Neurological: Negative.   Psychiatric/Behavioral: Positive for dysphoric mood.    Blood pressure (!) 147/89, pulse 92, temperature 98 F (36.7 C), temperature source Oral, resp. rate 17, height 5' 9.5" (1.765 m), weight 72.6 kg, SpO2 97 %.Body mass index is 23.29 kg/m.  General Appearance: Casual  Eye Contact:  Good  Speech:  Clear and Coherent  Volume:  Normal  Mood:  Euthymic  Affect:  Constricted  Thought Process:  Goal Directed  Orientation:  Full (Time, Place, and Person)  Thought Content:  Logical  Suicidal Thoughts:  No  Homicidal Thoughts:  No  Memory:  Immediate;   Fair Recent;   Fair Remote;   Fair  Judgement:  Fair  Insight:  Fair  Psychomotor Activity:  Normal  Concentration:  Concentration: Fair  Recall:  [employer] of Knowledge:  Fair  Language:  Fair  Akathisia:  No  Handed:  Right  AIMS (if indicated):     Assets:  Desire for Improvement Housing Social Support  ADL's:  Impaired  Cognition:  Impaired,  Mild  Sleep:        Treatment Plan Summary: Plan Patient looking better physically.  Has not yet started Remeron.  Reviewed with her plan for medication Remeron 15 mg to start tonight.  Supportive counseling and review of plan.  I will follow-up as needed.  Disposition: Patient does not meet criteria for psychiatric inpatient admission. Supportive therapy provided about ongoing stressors.  Alethia Berthold, MD 09/20/2020 6:44 PM

## 2020-09-20 NOTE — ED Notes (Signed)
Blue top sent to lab. 

## 2020-09-20 NOTE — Progress Notes (Signed)
Patient arrived to unit She appears to be in no apparent distress.  Able to transfer to bed from stretcher independently. IV Heparin and Cardizem infusing Pt tearful and visibly upset. Emotional support provided. Assessment completed as charted.   Pt educated on plan of care. Time allowed for questions and concerns.  Verbalizes an understanding on plan of care.  Denies any additional wants or needs  Call bell within reach. Will continue to closely monitor.

## 2020-09-20 NOTE — Progress Notes (Addendum)
PROGRESS NOTE    Gloria Gill  XTK:[id number] DOB: 1945-09-26 DOA: 09/19/2020 PCP: Gloria Lighter, MD  Outpatient Specialists: [facility]    Brief Narrative:   Gloria Gill is a 75 y.o. female with medical history significant for etoh (sober 3 years), a fib on eliquis, etoh cardiomyopathy, who presents with the above.  Few weeks of crampy suprapubic pain. Seen by pcp a week or so ago, clinical dx diverticulitis, started on augmentin. Didn't improved. Labs at f/u showed aki so referred to ED. Patient reports she was started on oxybutynin 3 weeks ago for urinary incontinence. No diarrhea or vomiting. No chest pain. No dysuria or fevers. Is making urine but thinks urinating less. No history kidney stones. Denies hematuria.  Told ED provider she's thinking of harming herself. Endorses SI and says plan is to drive into something with her car. 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 Denies alcohol or drug use.   Assessment & Plan:   Principal Problem:   Severe recurrent major depression without psychotic features ([place]) Active Problems:   Alcohol abuse   Cardiomyopathy ([place])   AKI (acute kidney injury) ([place])   HFrEF (heart failure with reduced ejection fraction) ([place])   Hydronephrosis   Suicidal ideation   Somatic symptom disorder   Atrial fibrillation with rapid ventricular response ([place])   # AKI # Bilateral hydronephrosis # Hematuria This appears to be a post-renal AKI. Cr 3.08 from normal previously. Today mild improvement to 2.79. CT does not show cause of obstruction. Urology following, ddx includes bladder dysfunction, malignancy, infection. K and bicarb wnl. 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 Per cardiology record most recent EF 40%. Appears compensated - hold lasix and losartan as above - cont home metoprolol  # Paroxysmal a fib # RVR Asymptomatic RVR last night, did not respond to metop IV, now on cardizem gtt - hold eliquis as above, start heparin - cont home metop - cont cardizem gtt - cardiology consulted for assistance w/ further mgmt   DVT prophylaxis: SCDs Code Status: full Family Communication:  None @ bedside  Level of care: Progressive Cardiac Status is: Inpatient  Remains inpatient appropriate because:Inpatient level of care appropriate due to severity of illness   Dispo: The patient is from: Home              Anticipated d/c is to: Home              Patient currently is not medically stable to d/c.   Difficult to place patient No        Consultants:  Urology, psychiatry, cardiology  Procedures: none  Antimicrobials:  none    Subjective: This morning continues to have crampy suprapubic pain. One episode nausea earlier this morning, no diarrhea. No chest pain or palpitations. No cough or sob. No fevers.   Objective: Vitals:   09/20/20 0230 09/20/20 0300 09/20/20 0330 09/20/20 0751  BP: (!) 147/105 (!) 148/101 (!) 145/86 134/90  Pulse: (!) 117 69 99 (!) 108  Resp: 19 16 17 18   Temp:    (!) 97.2 F (36.2 C)  TempSrc:    Oral  SpO2: 98%  96% 98% 96%  Weight:      Height:        Intake/Output Summary (Last 24 hours) at 09/20/2020 0757 Last data filed at 09/20/2020 0645 Gross per 24 hour  Intake -  Output 1010 ml  Net -1010 ml   Filed Weights   09/19/20 0948  Weight: 72.6 kg    Examination:  Constitutional: No acute distress Head: Atraumatic Eyes: Conjunctiva clear ENM: Moist mucous membranes.  Neck: Supple Respiratory: Clear to auscultation bilaterally, no wheezing/rales/rhonchi. Normal respiratory  effort. No accessory muscle use. . Cardiovascular: Regular rate and rhythm. No murmurs/rubs/gallops. Abdomen: mild ttp suprapubically. No masses. No rebound or guarding. Positive bowel sounds. Musculoskeletal: No joint deformity upper and lower extremities. Normal ROM, no contractures. Normal muscle tone.  Skin: No rashes, lesions, or ulcers.  Extremities: No peripheral edema. Palpable peripheral pulses. Neurologic: Alert, moving all 4 extremities. Psychiatric: Normal insight and judgement.    Data Reviewed: I have personally reviewed following labs and imaging studies  CBC: Recent Labs  Lab 09/19/20 0945 09/20/20 0407  WBC 12.7* 10.3  HGB 13.3 12.3  HCT 40.3 38.7  MCV 93.9 97.2  PLT 404* 811   Basic Metabolic Panel: Recent Labs  Lab 09/19/20 0945 09/20/20 0407  NA 138 141  K 4.1 4.8  CL 104 107  CO2 20* 25  GLUCOSE 210* 104*  BUN 33* 30*  CREATININE 3.08* 2.97*  CALCIUM 9.2 9.1   GFR: Estimated Creatinine Clearance: 17.1 mL/min (A) (by C-G formula based on SCr of 2.97 mg/dL (H)). Liver Function Tests: Recent Labs  Lab 09/19/20 0945  AST 25  ALT 21  ALKPHOS 49  BILITOT 0.8  PROT 7.2  ALBUMIN 3.8   Recent Labs  Lab 09/19/20 0945  LIPASE 35   No results for input(s): AMMONIA in the last 168 hours. Coagulation Profile: No results for input(s): INR, PROTIME in the last 168 hours. Cardiac Enzymes: No results for input(s): CKTOTAL, CKMB, CKMBINDEX, TROPONINI in the last 168 hours. BNP (last 3 results) No results for input(s): PROBNP in the last 8760 hours. HbA1C: No results for input(s): HGBA1C in the last 72 hours. CBG: No results for input(s): GLUCAP in the last 168 hours. Lipid Profile: No results for input(s): CHOL, HDL, LDLCALC, TRIG, CHOLHDL, LDLDIRECT in the last 72 hours. Thyroid Function Tests: No results for input(s): TSH, T4TOTAL, FREET4, T3FREE, THYROIDAB in the last 72 hours. Anemia Panel: No results for input(s): VITAMINB12, FOLATE,  FERRITIN, TIBC, IRON, RETICCTPCT in the last 72 hours. Urine analysis:    Component Value Date/Time   COLORURINE YELLOW (A) 09/19/2020 1501   APPEARANCEUR HAZY (A) 09/19/2020 1501   LABSPEC 1.010 09/19/2020 1501   PHURINE 5.0 09/19/2020 1501   GLUCOSEU NEGATIVE 09/19/2020 1501   HGBUR MODERATE (A) 09/19/2020 1501   BILIRUBINUR NEGATIVE 09/19/2020 1501   KETONESUR NEGATIVE 09/19/2020 1501   PROTEINUR 30 (A) 09/19/2020 1501   NITRITE NEGATIVE 09/19/2020 1501   LEUKOCYTESUR MODERATE (A) 09/19/2020 1501   Sepsis Labs: @LABRCNTIP (procalcitonin:4,lacticidven:4)  ) Recent Results (from the past 240 hour(s))  SARS CORONAVIRUS 2 (TAT 6-24 HRS) Nasopharyngeal Nasopharyngeal Swab     Status: None   Collection Time: 09/19/20 12:08 PM   Specimen: Nasopharyngeal Swab  Result Value Ref Range Status   SARS Coronavirus 2 NEGATIVE NEGATIVE Final    Comment: (NOTE) SARS-CoV-2 target nucleic acids are NOT DETECTED.  The SARS-CoV-2 RNA is generally detectable in upper and lower respiratory specimens during the acute phase of infection. Negative results do  not preclude SARS-CoV-2 infection, do not rule out co-infections with other pathogens, and should not be used as the sole basis for treatment or other patient management decisions. Negative results must be combined with clinical observations, patient history, and epidemiological information. The expected result is Negative.  Fact Sheet for Patients: SugarRoll.be  Fact Sheet for Healthcare Providers: https://www.woods-mathews.com/  This test is not yet approved or cleared by the [place] FDA and  has been authorized for detection and/or diagnosis of SARS-CoV-2 by FDA under an Emergency Use Authorization (EUA). This EUA will remain  in effect (meaning this test can be used) for the duration of the COVID-19 declaration under Se ction 564(b)(1) of the Act, 21 U.S.C. section 360bbb-3(b)(1), unless  the authorization is terminated or revoked sooner.  Performed at [facility], [place] [address], [place], [place] [address]          Radiology Studies: CT ABDOMEN PELVIS WO CONTRAST  Result Date: 09/19/2020 CLINICAL DATA:  Left lower quadrant pain. EXAM: CT ABDOMEN AND PELVIS WITHOUT CONTRAST TECHNIQUE: Multidetector CT imaging of the abdomen and pelvis was performed following the standard protocol without IV contrast. COMPARISON:  03/20/2005 FINDINGS: Lower chest: Small right pleural effusion.  No confluent opacities. Hepatobiliary: No focal hepatic abnormality. Gallbladder unremarkable. Pancreas: No focal abnormality or ductal dilatation. Spleen: No focal abnormality.  Normal size. Adrenals/Urinary Tract: Bilateral hydronephrosis. No renal or ureteral stones. The ureters are dilated to the urinary bladder which is decompressed. Adrenal glands unremarkable. Stomach/Bowel: Colonic diverticulosis diffusely. No active diverticulitis. Stomach and small bowel decompressed, unremarkable. Vascular/Lymphatic: Aortic atherosclerosis. No evidence of aneurysm or adenopathy. Reproductive: Uterus and adnexa unremarkable.  No mass. Other: Free fluid noted in the pelvis anterior to the uterus. No free air. Musculoskeletal: No acute bony abnormality. IMPRESSION: Diffuse colonic diverticulosis.  No active diverticulitis. Bilateral hydronephrosis of unknown etiology. Ureters are dilated to the bladder. No visible stones. Bladder is decompressed. Small amount of free fluid in the pelvis. Aortic atherosclerosis. Small right pleural effusion. Electronically Signed   By: Rolm Baptise M.D.   On: 09/19/2020 10:44        Scheduled Meds: . apixaban  5 mg Oral BID  . metoprolol succinate  100 mg Oral Daily  . sodium chloride flush  3 mL Intravenous Q12H   Continuous Infusions: . sodium chloride    . diltiazem (CARDIZEM) infusion 5 mg/hr (09/20/20 0442)     LOS: 1 day    Time spent: 44 min    Desma Maxim, MD [facility]   If 7PM-7AM, please contact night-coverage [email or web address] Password [facility] 09/20/2020, 7:57 AM

## 2020-09-20 NOTE — Consult Note (Signed)
[place] for transition from apixaban to heparin infusion Indication: atrial fibrillation  Patient Measurements: Height: 5' 9.5" (176.5 cm) Weight: 72.6 kg (160 lb) IBW/kg (Calculated) : 67.35  Vital Signs: Temp: 98.1 F (36.7 C) (03/03 2009) Temp Source: Oral (03/03 2009) BP: 157/82 (03/03 2009) Pulse Rate: 88 (03/03 2009)  Labs: Recent Labs    09/19/20 0945 09/20/20 0407 09/20/20 1210 09/20/20 2105  HGB 13.3 12.3  --   --   HCT 40.3 38.7  --   --   PLT 404* 343  --   --   APTT  --   --  33 122*  CREATININE 3.08* 2.97*  --   --     Estimated Creatinine Clearance: 17.1 mL/min (A) (by C-G formula based on SCr of 2.97 mg/dL (H)).   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 Pharmacy consulted for the transition of Eliquis to heparin. Her last Eliquis dose 09/19/20 1833   Date Time HL/aPTT Rate/Comment 3/03 2105 122s    Goals of Therapy:  aPTT 66 - 102 seconds Monitor platelets by anticoagulation protocol: Yes  Plan:   aPTT supratherapeutic @122s ; will decrease rate to 850 units/hr (~2u/k/h)  Check aPTT 8 hours after infusion has been initiated  Check anti-Xa level once daily: when aPTT and anti-Xa level correlate we will use anti-Xa levels to guide infusion rates  Repeat CBC in am  Lorna Dibble 09/20/2020,9:43 PM

## 2020-09-20 NOTE — ED Notes (Signed)
Pt resting with eyes closed; RR even and unlabored on 2L O2 via [place] - O2 sats however now 91% while pt at rest; O2 increased to 3L via [place] with improvement to 95%

## 2020-09-20 NOTE — ED Notes (Signed)
Dr Sidney Ace notified that patient remains uncontrolled a-fib with rates upwards of 130-150 -- orders to follow for 2nd Cardizem 5mg  IVP bolus then pt to start on Cardizem drip

## 2020-09-20 NOTE — ED Notes (Signed)
Indwelling foley cath remains patent -- gross bloody urine output noted in drainage bag

## 2020-09-20 NOTE — ED Notes (Signed)
Pt reporting abd pain has returned- states it is 10/10 and "feels like it did when I came in" -- prn Morphine ordered; see MAR for med administration

## 2020-09-20 NOTE — Progress Notes (Signed)
Pt expressing concern of being in a semi-private room Very adamant about leaving the hospital if she does not get into a private room Pt crying she "does not feel safe"   Attempting to get up out of bed. Crying she can not be in the room. Charge RN made aware and new bed assignment given

## 2020-09-20 NOTE — Progress Notes (Signed)
Urology Inpatient Progress Note  Subjective: Patient developed A. fib with RVR overnight, now on Cardizem drip.  She also developed gross hematuria overnight and Eliquis is being held. Creatinine stable today, 2.97.  WBC count down today, 10.3.  Urine culture pending. Foley catheter in place draining dark red urine without clots. Patient reports lower abdominal pain, resolved on morphine.  She reports generalized fatigue and malaise and slightly improved suicidal ideation with no active plans for self-harm.  Notably, she states she would not attempt to self-harm while hospitalized, but previously thought about driving her car into a tree consistent with what she reported to psychiatry yesterday.  She also reports recent unintentional weight loss associated with anorexia.  Anti-infectives: Anti-infectives (From admission, onward)   None      Current Facility-Administered Medications  Medication Dose Route Frequency Provider Last Rate Last Admin  . 0.9 %  sodium chloride infusion  250 mL Intravenous PRN Wouk, Ailene Rud, MD      . diltiazem (CARDIZEM) 125 mg in dextrose 5% 125 mL (1 mg/mL) infusion  5-15 mg/hr Intravenous Titrated Mansy, Jan A, MD 10 mL/hr at 09/20/20 0806 10 mg/hr at 09/20/20 0806  . metoprolol succinate (TOPROL-XL) 24 hr tablet 100 mg  100 mg Oral Daily Gwynne Edinger, MD   100 mg at 09/20/20 0905  . mirtazapine (REMERON) tablet 15 mg  15 mg Oral QHS Wouk, Ailene Rud, MD      . morphine 4 MG/ML injection 4 mg  4 mg Intravenous Q4H PRN Wouk, Ailene Rud, MD   4 mg at 09/20/20 0448  . ondansetron (ZOFRAN) injection 4 mg  4 mg Intravenous Q4H PRN Mansy, Jan A, MD   4 mg at 09/20/20 0108  . sodium chloride flush (NS) 0.9 % injection 3 mL  3 mL Intravenous Q12H Wouk, Ailene Rud, MD   3 mL at 09/20/20 0857  . sodium chloride flush (NS) 0.9 % injection 3 mL  3 mL Intravenous PRN Wouk, Ailene Rud, MD      . traZODone (DESYREL) tablet 25 mg  25 mg Oral QHS PRN Mansy, Jan  A, MD   25 mg at 09/19/20 2153   Current Outpatient Medications  Medication Sig Dispense Refill  . ELIQUIS 5 MG TABS tablet Take 5 mg by mouth 2 (two) times daily.    . metoprolol succinate (TOPROL-XL) 100 MG 24 hr tablet Take 100 mg by mouth daily.    Marland Kitchen oxybutynin (DITROPAN-XL) 10 MG 24 hr tablet Take 10 mg by mouth daily.    . furosemide (LASIX) 20 MG tablet Take 20 mg by mouth daily. (Patient not taking: No sig reported)    . losartan (COZAAR) 25 MG tablet Take 25 mg by mouth daily. (Patient not taking: No sig reported)    . meloxicam (MOBIC) 15 MG tablet Take 15 mg by mouth daily. (Patient not taking: No sig reported)    . venlafaxine XR (EFFEXOR-XR) 37.5 MG 24 hr capsule Take 37.5 mg by mouth daily. (Patient not taking: No sig reported)    . Vitamin D, Ergocalciferol, (DRISDOL) 1.25 MG (50000 UNIT) CAPS capsule Take 50,000 Units by mouth once a week.     Objective: Vital signs in last 24 hours: Temp:  [97.2 F (36.2 C)-98.4 F (36.9 C)] 97.2 F (36.2 C) (03/03 0751) Pulse Rate:  [53-155] 108 (03/03 0751) Resp:  [13-24] 18 (03/03 0751) BP: (129-150)/(85-114) 134/90 (03/03 0751) SpO2:  [92 %-100 %] 96 % (03/03 0751) Weight:  [72.6 kg] 72.6  kg (03/02 0948)  Intake/Output from previous day: 03/02 0701 - 03/03 0700 In: -  Out: 1010 [Urine:1010] Intake/Output this shift: No intake/output data recorded.  Physical Exam Vitals and nursing note reviewed.  Constitutional:      Appearance: She is not ill-appearing, toxic-appearing or diaphoretic.  HENT:     Head: Normocephalic and atraumatic.  Pulmonary:     Effort: Pulmonary effort is normal. No respiratory distress.  Skin:    General: Skin is warm and dry.  Neurological:     Mental Status: She is alert and oriented to person, place, and time.  Psychiatric:        Mood and Affect: Mood is depressed.        Behavior: Behavior is cooperative.        Thought Content: Thought content includes suicidal ideation.    Lab Results:   Recent Labs    09/19/20 0945 09/20/20 0407  WBC 12.7* 10.3  HGB 13.3 12.3  HCT 40.3 38.7  PLT 404* 343   BMET Recent Labs    09/19/20 0945 09/20/20 0407  NA 138 141  K 4.1 4.8  CL 104 107  CO2 20* 25  GLUCOSE 210* 104*  BUN 33* 30*  CREATININE 3.08* 2.97*  CALCIUM 9.2 9.1   Assessment & Plan: 75 year old female with AKI secondary to bilateral hydronephrosis in the setting of a decompressed urinary bladder with irregular bladder wall thickening on cross-sectional imaging consistent with possible high pressure bladder with reflux versus mass.  Unfortunately, her creatinine is not significantly improved after placing a Foley catheter for maximum bladder decompression and she has developed gross hematuria without clots.  Eliquis being held secondary to gross hematuria.  With creatinine not significantly improved on maximal bladder decompression, suspect bladder mass obstructing the UOs is more likely than high-pressure bladder.  Will plan to add her on to Dr. Cherrie Gauze OR schedule Monday for cystoscopy and bilateral retrograde pyelograms with possible TURBT versus biopsy versus bilateral ureteral stent placement. She will need to be off Eliquis for 2 days prior to procedure, OK to bridge to warfarin and stop it the morning of procedure.  Recommendations: -Continue to follow urine culture -Maintain Foley catheter and irrigate as needed to keep it draining -Trend blood counts and creatinine -Add on procedure Monday as above, off Eliquis at least 2 days prior.  Debroah Loop, PA-C 09/20/2020

## 2020-09-20 NOTE — ED Notes (Addendum)
Dr Argie Ramming notified via secure chat of bright red urine output in foley bag (order to add cbc to a.m. labs) - pt resting quietly with eyes closed; RR even and unlabored on 2L O2 via [place]; O2 sats 96%.  Cardiac monitoring maintained as pt remains on Cardizem drip infusing @5mg /hr - showing controlled A-Fib HR 92 at this time; does however intermittently fluctuates to uncontrolled rate - low 100s

## 2020-09-20 NOTE — Consult Note (Signed)
[place] for transition from apixaban to heparin infusion Indication: atrial fibrillation  Patient Measurements: Height: 5' 9.5" (176.5 cm) Weight: 72.6 kg (160 lb) IBW/kg (Calculated) : 67.35  Vital Signs: Temp: 97.2 F (36.2 C) (03/03 0751) Temp Source: Oral (03/03 0751) BP: 134/85 (03/03 1100) Pulse Rate: 108 (03/03 0751)  Labs: Recent Labs    09/19/20 0945 09/20/20 0407  HGB 13.3 12.3  HCT 40.3 38.7  PLT 404* 343  CREATININE 3.08* 2.97*    Estimated Creatinine Clearance: 17.1 mL/min (A) (by C-G formula based on SCr of 2.97 mg/dL (H)).   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 Pharmacy consulted for the transition of Eliquis to heparin. Her last Eliquis dose 09/19/20 1833   Goals of Therapy:  aPTT 66 - 102 seconds Monitor platelets by anticoagulation protocol: Yes  Plan:   Initiate heparin with a bolus dose of 4000 units followed by 1000 units/hr  Check aPTT 8 hours after infusion has been initiated  Check anti-Xa level once daily: when aPTT and anti-Xa level correlate we will use anti-Xa levels to guide infusion rates  Repeat CBC in am  Dallie Piles 09/20/2020,11:57 AM

## 2020-09-20 NOTE — ED Notes (Signed)
Verbal order to hold Eliquis d/t new onset gross hematuria

## 2020-09-20 NOTE — Consult Note (Signed)
CARDIOLOGY CONSULT NOTE               Patient ID: JORJA EMPIE MRN: [id number] DOB/AGE: April 28, 1946 75 y.o.  Admit date: 09/19/2020 Referring Physician [facility] Primary Physician [facility] Primary Cardiologist Fath Reason for Consultation Atrial fibrillation with RVR  HPI: 75 year old female referred for evaluation of atrial fibrillation with RVR. 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 The patient presented to her primary care provider's office yesterday for 3 week history of abdominal pain with gross hematuria, loss of appetite, depression, and suicidal ideations due to emotional stress at home, with labs revealing acute kidney injury. The patient was advised to go to the ER for further management. Admission labs reveal BUN 30, creatinine 2.97. CT abdomen/pelvis showed bilateral hydronephrosis of unknown etiology, small amount of free fluid in the pelvis, and small right pleural effusion. Her last dose of Eliquis was the morning of 3/2, and has since been held due to gross hematuria. ECG revealed atrial fibrillation at a rate of 148 bpm. She was started on Cardizem drip. The patient denies any recent chest pain, shortness of breath, palpitations, peripheral edema, presyncope, syncope, or orthopnea. Losartan and furosemide were discontinued previously due to AKI.   Review of systems complete and found to be negative unless listed above     Past Medical History:  Diagnosis Date  . Atrial fibrillation ([place])   . Hypertension     Past Surgical History:  Procedure Laterality Date  . APPENDECTOMY    . BREAST SURGERY    . REDUCTION MAMMAPLASTY Bilateral 1981   pt stated she had implants with these scars/had implants removed 2009    (Not in a hospital admission)  Social History   Socioeconomic History  . Marital status: Married    Spouse name: Not on file  . Number of  children: Not on file  . Years of education: Not on file  . Highest education level: Not on file  Occupational History  . Not on file  Tobacco Use  . Smoking status: Never Smoker  . Smokeless tobacco: Never Used  Substance and Sexual Activity  . Alcohol use: Not Currently  . Drug use: Never  . Sexual activity: Not on file  Other Topics Concern  . Not on file  Social History Narrative  . Not on file   Social Determinants of Health   Financial Resource Strain: Not on file  Food Insecurity: Not on file  Transportation Needs: Not on file  Physical Activity: Not on file  Stress: Not on file  Social Connections: Not on file  Intimate Partner Violence: Not on file    Family History  Problem Relation Age of Onset  . Breast cancer Neg Hx       Review of systems complete and found to be negative unless listed above      PHYSICAL EXAM  General: Well developed, well nourished, in no acute distress HEENT:  Normocephalic and atramatic Neck:  No JVD.  Lungs: Clear bilaterally to auscultation and percussion. Heart: irregularly irregular without gallops or murmurs.  Msk:  Back normal, normal gait. Normal strength and tone for age. Extremities: No clubbing, cyanosis or edema.   Neuro: Alert and oriented X 3. Psych:  Good affect, responds appropriately  Labs:   Lab Results  Component Value Date   WBC 10.3 09/20/2020   HGB 12.3 09/20/2020   HCT 38.7 09/20/2020   MCV 97.2 09/20/2020  PLT 343 09/20/2020    Recent Labs  Lab 09/19/20 0945 09/20/20 0407  NA 138 141  K 4.1 4.8  CL 104 107  CO2 20* 25  BUN 33* 30*  CREATININE 3.08* 2.97*  CALCIUM 9.2 9.1  PROT 7.2  --   BILITOT 0.8  --   ALKPHOS 49  --   ALT 21  --   AST 25  --   GLUCOSE 210* 104*   Lab Results  Component Value Date   TROPONINI <0.03 12/02/2017   No results found for: CHOL No results found for: HDL No results found for: LDLCALC No results found for: TRIG No results found for: CHOLHDL No  results found for: LDLDIRECT    Radiology: CT ABDOMEN PELVIS WO CONTRAST  Result Date: 09/19/2020 CLINICAL DATA:  Left lower quadrant pain. EXAM: CT ABDOMEN AND PELVIS WITHOUT CONTRAST TECHNIQUE: Multidetector CT imaging of the abdomen and pelvis was performed following the standard protocol without IV contrast. COMPARISON:  03/20/2005 FINDINGS: Lower chest: Small right pleural effusion.  No confluent opacities. Hepatobiliary: No focal hepatic abnormality. Gallbladder unremarkable. Pancreas: No focal abnormality or ductal dilatation. Spleen: No focal abnormality.  Normal size. Adrenals/Urinary Tract: Bilateral hydronephrosis. No renal or ureteral stones. The ureters are dilated to the urinary bladder which is decompressed. Adrenal glands unremarkable. Stomach/Bowel: Colonic diverticulosis diffusely. No active diverticulitis. Stomach and small bowel decompressed, unremarkable. Vascular/Lymphatic: Aortic atherosclerosis. No evidence of aneurysm or adenopathy. Reproductive: Uterus and adnexa unremarkable.  No mass. Other: Free fluid noted in the pelvis anterior to the uterus. No free air. Musculoskeletal: No acute bony abnormality. IMPRESSION: Diffuse colonic diverticulosis.  No active diverticulitis. Bilateral hydronephrosis of unknown etiology. Ureters are dilated to the bladder. No visible stones. Bladder is decompressed. Small amount of free fluid in the pelvis. Aortic atherosclerosis. Small right pleural effusion. Electronically Signed   By: Rolm Baptise M.D.   On: 09/19/2020 10:44    EKG: atrial fibrillation with RVR  ASSESSMENT AND PLAN:  1. Atrial fibrillation with RVR in the setting of AKI and bilateral hydronephrosis, with known history of paroxysmal atrial fibrillation on Eliquis and metoprolol. Currently on Cardizem drip with controlled ventricular rate. 2. Bilateral hydronephrosis, urology consulted 3. Alcoholic cardiomyopathy, with LVEF 40% per echocardiogram 2020, on metoprolol, previously on  losartan, held due to AKI. Appears euvolemic.   Recommendations: 1. Agree with current therapy 2. Continue to hold Eliquis and resume as soon as safely possible 3. Continue metoprolol succinate 100 mg daily for rate control 4. Continue Cardizem drip for now; will plan to transition to oral when rate consistently stable    Signed: Clabe Seal  PA-C 09/20/2020, 8:38 AM

## 2020-09-20 NOTE — ED Notes (Signed)
Pt denies cp, sob, dizziness, weakness- reporting only nausea at this time - Dr Sidney Ace notified via secure chat of pt c/o nausea; requesting anti-emetic; awaiting response from provider.  Cardizem drip now infusing @5mg /hr to 20G L FA -- vital sign monitoring increased to Q65min -- Pt now fluctuating between controlled A-Fib 90s and uncontrolled A-Fib low 100s.  Pt increased O2 demands at this time as O2 sats on RA noted to desat to 91% -- RR however remain unlabored  - pt subsequently placed on 2L O2 via [place] with improvement to 96%.

## 2020-09-20 NOTE — ED Notes (Signed)
Dr Sidney Ace notified via secure chat that patient remains in uncontrolled A-Fib HR 115-130-- order to administer 5mg  IVP Cardizem push x1 dose

## 2020-09-21 DIAGNOSIS — N132 Hydronephrosis with renal and ureteral calculous obstruction: Secondary | ICD-10-CM

## 2020-09-21 DIAGNOSIS — F332 Major depressive disorder, recurrent severe without psychotic features: Secondary | ICD-10-CM | POA: Diagnosis not present

## 2020-09-21 LAB — BASIC METABOLIC PANEL
Anion gap: 10 (ref 5–15)
BUN: 25 mg/dL — ABNORMAL HIGH (ref 8–23)
CO2: 24 mmol/L (ref 22–32)
Calcium: 8.8 mg/dL — ABNORMAL LOW (ref 8.9–10.3)
Chloride: 107 mmol/L (ref 98–111)
Creatinine, Ser: 2.69 mg/dL — ABNORMAL HIGH (ref 0.44–1.00)
GFR, Estimated: 18 mL/min — ABNORMAL LOW (ref >60)
Glucose, Bld: 104 mg/dL — ABNORMAL HIGH (ref 70–99)
Potassium: 4 mmol/L (ref 3.5–5.1)
Sodium: 141 mmol/L (ref 135–145)

## 2020-09-21 LAB — CBC
HCT: 38.9 % (ref 36.0–46.0)
Hemoglobin: 12.5 g/dL (ref 12.0–15.0)
MCH: 31 pg (ref 26.0–34.0)
MCHC: 32.1 g/dL (ref 30.0–36.0)
MCV: 96.5 fL (ref 80.0–100.0)
Platelets: 363 10*3/uL (ref 150–400)
RBC: 4.03 MIL/uL (ref 3.87–5.11)
RDW: 12.4 % (ref 11.5–15.5)
WBC: 11.3 10*3/uL — ABNORMAL HIGH (ref 4.0–10.5)
nRBC: 0 % (ref 0.0–0.2)

## 2020-09-21 LAB — HEPARIN LEVEL (UNFRACTIONATED): Heparin Unfractionated: 3.2 IU/mL — ABNORMAL HIGH (ref 0.30–0.70)

## 2020-09-21 LAB — URINE CULTURE: Culture: <10000 — AB

## 2020-09-21 LAB — APTT
aPTT: 57 seconds — ABNORMAL HIGH (ref 24–36)
aPTT: 82 seconds — ABNORMAL HIGH (ref 24–36)

## 2020-09-21 MED ORDER — DILTIAZEM HCL 30 MG PO TABS
60.0000 mg | ORAL_TABLET | Freq: Four times a day (QID) | ORAL | Status: DC
  Administered 2020-09-21 – 2020-09-23 (×8): 60 mg via ORAL
  Filled 2020-09-21 (×8): qty 2

## 2020-09-21 MED ORDER — CHLORHEXIDINE GLUCONATE CLOTH 2 % EX PADS
6.0000 | MEDICATED_PAD | Freq: Every day | CUTANEOUS | Status: DC
  Administered 2020-09-21 – 2020-10-02 (×11): 6 via TOPICAL

## 2020-09-21 NOTE — H&P (View-Only) (Signed)
Urology Inpatient Progress Note  Subjective: No acute events overnight. Diltiazem d/ced this morning. Hemoglobin stable today, 12.5.  Creatinine down today, 2.69. Foley catheter in place draining pink-tinged urine without clots. Patient denies pain today.  Anti-infectives: Anti-infectives (From admission, onward)   None      Current Facility-Administered Medications  Medication Dose Route Frequency Provider Last Rate Last Admin  . 0.9 %  sodium chloride infusion  250 mL Intravenous PRN Wouk, Ailene Rud, MD      . Chlorhexidine Gluconate Cloth 2 % PADS 6 each  6 each Topical Daily Wouk, Ailene Rud, MD      . diltiazem (CARDIZEM) tablet 60 mg  60 mg Oral Q6H Drane, Anna, PA-C      . heparin ADULT infusion 100 units/mL (25000 units/286mL)  950 Units/hr Intravenous Continuous Hart Robinsons A, RPH 9.5 mL/hr at 09/21/20 0808 950 Units/hr at 09/21/20 0808  . metoprolol succinate (TOPROL-XL) 24 hr tablet 100 mg  100 mg Oral Daily Gwynne Edinger, MD   100 mg at 09/20/20 0905  . mirtazapine (REMERON) tablet 15 mg  15 mg Oral QHS Gwynne Edinger, MD   15 mg at 09/20/20 2037  . morphine 4 MG/ML injection 4 mg  4 mg Intravenous Q4H PRN Wouk, Ailene Rud, MD   4 mg at 09/20/20 0448  . ondansetron (ZOFRAN) injection 4 mg  4 mg Intravenous Q4H PRN Mansy, Jan A, MD   4 mg at 09/20/20 0108  . sodium chloride flush (NS) 0.9 % injection 3 mL  3 mL Intravenous Q12H Wouk, Ailene Rud, MD   3 mL at 09/20/20 0857  . sodium chloride flush (NS) 0.9 % injection 3 mL  3 mL Intravenous PRN Wouk, Ailene Rud, MD      . traZODone (DESYREL) tablet 25 mg  25 mg Oral QHS PRN Mansy, Jan A, MD   25 mg at 09/20/20 2037   Objective: Vital signs in last 24 hours: Temp:  [97.7 F (36.5 C)-98.3 F (36.8 C)] 98.1 F (36.7 C) (03/04 0722) Pulse Rate:  [84-102] 101 (03/04 0722) Resp:  [13-19] 17 (03/04 0722) BP: (114-157)/(76-93) 114/88 (03/04 0722) SpO2:  [91 %-97 %] 91 % (03/04 0722) Weight:  [73.6 kg] 73.6  kg (03/04 0440)  Intake/Output from previous day: 03/03 0701 - 03/04 0700 In: 578.1 [P.O.:240; I.V.:338.1] Out: 650 [Urine:650] Intake/Output this shift: No intake/output data recorded.  Physical Exam Vitals and nursing note reviewed.  Constitutional:      General: She is not in acute distress.    Appearance: She is not ill-appearing, toxic-appearing or diaphoretic.  HENT:     Head: Normocephalic and atraumatic.  Pulmonary:     Effort: Pulmonary effort is normal. No respiratory distress.  Skin:    General: Skin is warm and dry.  Neurological:     Mental Status: She is alert and oriented to person, place, and time.  Psychiatric:        Mood and Affect: Mood is depressed.    Lab Results:  Recent Labs    09/20/20 0407 09/21/20 0558  WBC 10.3 11.3*  HGB 12.3 12.5  HCT 38.7 38.9  PLT 343 363   BMET Recent Labs    09/20/20 0407 09/21/20 0558  NA 141 141  K 4.8 4.0  CL 107 107  CO2 25 24  GLUCOSE 104* 104*  BUN 30* 25*  CREATININE 2.97* 2.69*  CALCIUM 9.1 8.8*   Assessment & Plan: 75 year old female with AKI secondary to bilateral hydronephrosis  in the setting of a decompressed urinary bladder with irregular bladder wall thickening on cross-sectional imaging consistent with possible high-pressure bladder with reflux versus mass.  AKI slightly improved with significant improvement in gross hematuria and Foley catheter in place.  We will proceed with plans for cystoscopy and bilateral retrograde pyelograms with possible TURBT versus biopsy versus bilateral ureteral stent placement on Monday as previously discussed.  I shared the plan with the patient and she is in agreement.  Okay to continue heparin, please make her NPO Sunday.  Debroah Loop, PA-C 09/21/2020

## 2020-09-21 NOTE — Progress Notes (Signed)
Mobility Specialist - Progress Note   09/21/20 1540  Mobility  Activity Ambulated in hall  Level of Assistance Independent after set-up  Assistive Device Front wheel walker  Distance Ambulated (ft) 750 ft  Mobility Response Tolerated well  Mobility performed by Mobility specialist  $Mobility charge 1 Mobility    Pre-mobility: 80 HR, 95% SpO2 During mobility: 53 HR, 91% SpO2 Post-mobility: 99 HR, 97% SpO2   Pt was lying in bed upon arrival utilizing room air. Pt agreed to session. Pt denied pain, nausea, and fatigue. Pt does report depression, but denies suicidal ideation at this time. Pt was able to get EOB without physical assistance except to manage urethral cath and IV. Pt stood at bedside with no reports of dizziness. Pt ambulated 750' in hallway with decreased speed. No LOB noted. Pt denied SOB, weakness, and fatigue throughout session. O2 > 90% during activity with A-fib pattern ranging between 53-110 bpm. Pt very interactive and engaging throughout session. Upon return to room, pt reports that she has not had a BM in 3 days but also states that today is the first day her appetite has improved. While seated EOB, pt requested set-up for teeth cleansing, mobility provided tooth brush and paste. Pt stood to sink to engage in dental hygiene. Pt encouraged to sit up in recliner and agreeable. Mobility set up recliner, hazardous items out of reach. Pt sat in recliner expressing that she feels a little better after mobility session and thanked her for her patience and time. Overall, pt tolerated session well. Pt was left in bed with all needs in reach and alarm set. Nurse notified.    Kathee Delton Mobility Specialist 09/21/20, 4:05 PM

## 2020-09-21 NOTE — Progress Notes (Addendum)
PROGRESS NOTE    Gloria Gill  JGG:[id number] DOB: 1946-03-04 DOA: 09/19/2020 PCP: [name], MD  Outpatient Specialists: [facility]    Brief Narrative:   Gloria Gill is a 75 y.o. female with medical history significant for etoh (sober 3 years), a fib on eliquis, etoh cardiomyopathy, who presents with the above.  Few weeks of crampy suprapubic pain. Seen by pcp a week or so ago, clinical dx diverticulitis, started on augmentin. Didn't improved. Labs at f/u showed aki so referred to ED. Patient reports she was started on oxybutynin 3 weeks ago for urinary incontinence. No diarrhea or vomiting. No chest pain. No dysuria or fevers. Is making urine but thinks urinating less. No history kidney stones. Denies hematuria.  Told ED provider she's thinking of harming herself. Endorses SI and says plan is to drive into something with her car. 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 Denies alcohol or drug use.   Assessment & Plan:   Principal Problem:   Severe recurrent major depression without psychotic features ([place]) Active Problems:   Alcohol abuse   Cardiomyopathy ([place])   AKI (acute kidney injury) ([place])   HFrEF (heart failure with reduced ejection fraction) ([place])   Hydronephrosis   Suicidal ideation   Somatic symptom disorder   Atrial fibrillation with rapid ventricular response ([place])   # AKI # Bilateral hydronephrosis # Hematuria This appears to be a post-renal AKI. Cr 3.08 from normal previously. Today mild improvement to 2.69. CT does not show cause of obstruction. Urology following, ddx includes bladder dysfunction, malignancy, infection. K and bicarb wnl. New hematuria improving, h/h stable. Pain resolved. Urine culture neg. 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 Per cardiology record most recent EF 40%. Appears compensated - hold lasix and losartan as above - cont home metoprolol  # Paroxysmal a fib # RVR rvr resolved with cardizem gtt, now off - hold eliquis as above, started heparin - cont home metop - cont cardizem oral - cardiology following   DVT prophylaxis: heparin Code Status: full Family Communication:  None @ bedside  Level of care: Progressive Cardiac Status is: Inpatient  Remains inpatient appropriate because:Inpatient level of care appropriate due to severity of illness   Dispo: The patient is from: Home              Anticipated d/c is to: Home              Patient currently is not medically stable to d/c.   Difficult to place patient No        Consultants:  Urology, psychiatry, cardiology  Procedures: none  Antimicrobials:  none    Subjective: Has appetite, abd pain resolved. Still feels depressed. Hematuria improved.  Objective: Vitals:   09/21/20 0105 09/21/20 0440 09/21/20 0722 09/21/20 1121  BP: 124/81 133/76 114/88 121/82  Pulse: 89 (!) 102 (!) 101 100  Resp: 18 19 17 18   Temp: 98.1 F (36.7 C) 98.3 F (36.8 C) 98.1 F (36.7 C) 98 F (36.7 C)  TempSrc:  Oral  Oral  SpO2: 92% 92% 91% 93%  Weight:  73.6 kg    Height:  Intake/Output Summary (Last 24 hours) at 09/21/2020 1223 Last data filed at 09/21/2020 1137 Gross per 24 hour  Intake 858.65 ml  Output 950 ml  Net -91.35 ml   Filed Weights   09/19/20 0948 09/21/20 0440  Weight: 72.6 kg 73.6 kg    Examination:  Constitutional: No acute distress Head: Atraumatic Eyes: Conjunctiva clear ENM: Moist mucous membranes.  Neck: Supple Respiratory: Clear to auscultation bilaterally, no wheezing/rales/rhonchi. Normal respiratory effort. No accessory  muscle use. . Cardiovascular: Regular rate and rhythm. No murmurs/rubs/gallops. Abdomen: soft, non-tender. No masses. No rebound or guarding. Positive bowel sounds. Musculoskeletal: No joint deformity upper and lower extremities. Normal ROM, no contractures. Normal muscle tone.  Skin: No rashes, lesions, or ulcers.  Extremities: No peripheral edema. Palpable peripheral pulses. Neurologic: Alert, moving all 4 extremities. Psychiatric: Normal insight and judgement.    Data Reviewed: I have personally reviewed following labs and imaging studies  CBC: Recent Labs  Lab 09/19/20 0945 09/20/20 0407 09/21/20 0558  WBC 12.7* 10.3 11.3*  HGB 13.3 12.3 12.5  HCT 40.3 38.7 38.9  MCV 93.9 97.2 96.5  PLT 404* 343 229   Basic Metabolic Panel: Recent Labs  Lab 09/19/20 0945 09/20/20 0407 09/21/20 0558  NA 138 141 141  K 4.1 4.8 4.0  CL 104 107 107  CO2 20* 25 24  GLUCOSE 210* 104* 104*  BUN 33* 30* 25*  CREATININE 3.08* 2.97* 2.69*  CALCIUM 9.2 9.1 8.8*   GFR: Estimated Creatinine Clearance: 18.9 mL/min (A) (by C-G formula based on SCr of 2.69 mg/dL (H)). Liver Function Tests: Recent Labs  Lab 09/19/20 0945  AST 25  ALT 21  ALKPHOS 49  BILITOT 0.8  PROT 7.2  ALBUMIN 3.8   Recent Labs  Lab 09/19/20 0945  LIPASE 35   No results for input(s): AMMONIA in the last 168 hours. Coagulation Profile: No results for input(s): INR, PROTIME in the last 168 hours. Cardiac Enzymes: No results for input(s): CKTOTAL, CKMB, CKMBINDEX, TROPONINI in the last 168 hours. BNP (last 3 results) No results for input(s): PROBNP in the last 8760 hours. HbA1C: No results for input(s): HGBA1C in the last 72 hours. CBG: No results for input(s): GLUCAP in the last 168 hours. Lipid Profile: No results for input(s): CHOL, HDL, LDLCALC, TRIG, CHOLHDL, LDLDIRECT in the last 72 hours. Thyroid Function Tests: No results for input(s): TSH, T4TOTAL, FREET4, T3FREE, THYROIDAB in the last 72  hours. Anemia Panel: No results for input(s): VITAMINB12, FOLATE, FERRITIN, TIBC, IRON, RETICCTPCT in the last 72 hours. Urine analysis:    Component Value Date/Time   COLORURINE YELLOW (A) 09/19/2020 1501   APPEARANCEUR HAZY (A) 09/19/2020 1501   LABSPEC 1.010 09/19/2020 1501   PHURINE 5.0 09/19/2020 1501   GLUCOSEU NEGATIVE 09/19/2020 1501   HGBUR MODERATE (A) 09/19/2020 1501   BILIRUBINUR NEGATIVE 09/19/2020 1501   KETONESUR NEGATIVE 09/19/2020 1501   PROTEINUR 30 (A) 09/19/2020 1501   NITRITE NEGATIVE 09/19/2020 1501   LEUKOCYTESUR MODERATE (A) 09/19/2020 1501   Sepsis Labs: @LABRCNTIP (procalcitonin:4,lacticidven:4)  ) Recent Results (from the past 240 hour(s))  SARS CORONAVIRUS 2 (TAT 6-24 HRS) Nasopharyngeal Nasopharyngeal Swab     Status: None   Collection Time: 09/19/20 12:08 PM   Specimen: Nasopharyngeal Swab  Result Value Ref Range Status   SARS Coronavirus 2 NEGATIVE NEGATIVE Final    Comment: (NOTE) SARS-CoV-2 target nucleic acids are NOT DETECTED.  The SARS-CoV-2 RNA is generally detectable in upper and lower respiratory specimens during the acute phase of infection.  Negative results do not preclude SARS-CoV-2 infection, do not rule out co-infections with other pathogens, and should not be used as the sole basis for treatment or other patient management decisions. Negative results must be combined with clinical observations, patient history, and epidemiological information. The expected result is Negative.  Fact Sheet for Patients: SugarRoll.be  Fact Sheet for Healthcare Providers: https://www.woods-mathews.com/  This test is not yet approved or cleared by the [place] FDA and  has been authorized for detection and/or diagnosis of SARS-CoV-2 by FDA under an Emergency Use Authorization (EUA). This EUA will remain  in effect (meaning this test can be used) for the duration of the COVID-19 declaration under Se  ction 564(b)(1) of the Act, 21 U.S.C. section 360bbb-3(b)(1), unless the authorization is terminated or revoked sooner.  Performed at [facility], [place] [address], [place], [place] [address]   Urine Culture     Status: Abnormal   Collection Time: 09/19/20  4:33 PM   Specimen: Urine, Random  Result Value Ref Range Status   Specimen Description   Final    URINE, RANDOM Performed at [facility], [address], [place], [place] [address]    Special Requests   Final    NONE Performed at [facility], [address] [place], [place] [address]    Culture (A)  Final    <10,000 COLONIES/mL INSIGNIFICANT GROWTH Performed at [facility], [place] [address] [place],  [address]    Report Status 09/21/2020 FINAL  Final         Radiology Studies: No results found.      Scheduled Meds: . Chlorhexidine Gluconate Cloth  6 each Topical Daily  . diltiazem  60 mg Oral Q6H  . metoprolol succinate  100 mg Oral Daily  . mirtazapine  15 mg Oral QHS  . sodium chloride flush  3 mL Intravenous Q12H   Continuous Infusions: . sodium chloride    . heparin 950 Units/hr (09/21/20 0808)     LOS: 2 days    Time spent: 47 min    Desma Maxim, MD [facility]   If 7PM-7AM, please contact night-coverage [email or web address] Password [facility] 09/21/2020, 12:23 PM

## 2020-09-21 NOTE — Progress Notes (Signed)
Urology Inpatient Progress Note  Subjective: No acute events overnight. Diltiazem d/ced this morning. Hemoglobin stable today, 12.5.  Creatinine down today, 2.69. Foley catheter in place draining pink-tinged urine without clots. Patient denies pain today.  Anti-infectives: Anti-infectives (From admission, onward)   None      Current Facility-Administered Medications  Medication Dose Route Frequency Provider Last Rate Last Admin  . 0.9 %  sodium chloride infusion  250 mL Intravenous PRN Wouk, Ailene Rud, MD      . Chlorhexidine Gluconate Cloth 2 % PADS 6 each  6 each Topical Daily Wouk, Ailene Rud, MD      . diltiazem (CARDIZEM) tablet 60 mg  60 mg Oral Q6H Drane, Anna, PA-C      . heparin ADULT infusion 100 units/mL (25000 units/210mL)  950 Units/hr Intravenous Continuous Hart Robinsons A, RPH 9.5 mL/hr at 09/21/20 0808 950 Units/hr at 09/21/20 0808  . metoprolol succinate (TOPROL-XL) 24 hr tablet 100 mg  100 mg Oral Daily Gwynne Edinger, MD   100 mg at 09/20/20 0905  . mirtazapine (REMERON) tablet 15 mg  15 mg Oral QHS Gwynne Edinger, MD   15 mg at 09/20/20 2037  . morphine 4 MG/ML injection 4 mg  4 mg Intravenous Q4H PRN Wouk, Ailene Rud, MD   4 mg at 09/20/20 0448  . ondansetron (ZOFRAN) injection 4 mg  4 mg Intravenous Q4H PRN Mansy, Jan A, MD   4 mg at 09/20/20 0108  . sodium chloride flush (NS) 0.9 % injection 3 mL  3 mL Intravenous Q12H Wouk, Ailene Rud, MD   3 mL at 09/20/20 0857  . sodium chloride flush (NS) 0.9 % injection 3 mL  3 mL Intravenous PRN Wouk, Ailene Rud, MD      . traZODone (DESYREL) tablet 25 mg  25 mg Oral QHS PRN Mansy, Jan A, MD   25 mg at 09/20/20 2037   Objective: Vital signs in last 24 hours: Temp:  [97.7 F (36.5 C)-98.3 F (36.8 C)] 98.1 F (36.7 C) (03/04 0722) Pulse Rate:  [84-102] 101 (03/04 0722) Resp:  [13-19] 17 (03/04 0722) BP: (114-157)/(76-93) 114/88 (03/04 0722) SpO2:  [91 %-97 %] 91 % (03/04 0722) Weight:  [73.6 kg] 73.6  kg (03/04 0440)  Intake/Output from previous day: 03/03 0701 - 03/04 0700 In: 578.1 [P.O.:240; I.V.:338.1] Out: 650 [Urine:650] Intake/Output this shift: No intake/output data recorded.  Physical Exam Vitals and nursing note reviewed.  Constitutional:      General: She is not in acute distress.    Appearance: She is not ill-appearing, toxic-appearing or diaphoretic.  HENT:     Head: Normocephalic and atraumatic.  Pulmonary:     Effort: Pulmonary effort is normal. No respiratory distress.  Skin:    General: Skin is warm and dry.  Neurological:     Mental Status: She is alert and oriented to person, place, and time.  Psychiatric:        Mood and Affect: Mood is depressed.    Lab Results:  Recent Labs    09/20/20 0407 09/21/20 0558  WBC 10.3 11.3*  HGB 12.3 12.5  HCT 38.7 38.9  PLT 343 363   BMET Recent Labs    09/20/20 0407 09/21/20 0558  NA 141 141  K 4.8 4.0  CL 107 107  CO2 25 24  GLUCOSE 104* 104*  BUN 30* 25*  CREATININE 2.97* 2.69*  CALCIUM 9.1 8.8*   Assessment & Plan: 75 year old female with AKI secondary to bilateral hydronephrosis  in the setting of a decompressed urinary bladder with irregular bladder wall thickening on cross-sectional imaging consistent with possible high-pressure bladder with reflux versus mass.  AKI slightly improved with significant improvement in gross hematuria and Foley catheter in place.  We will proceed with plans for cystoscopy and bilateral retrograde pyelograms with possible TURBT versus biopsy versus bilateral ureteral stent placement on Monday as previously discussed.  I shared the plan with the patient and she is in agreement.  Okay to continue heparin, please make her NPO Sunday.  Debroah Loop, PA-C 09/21/2020

## 2020-09-21 NOTE — Consult Note (Signed)
[place] for transition from apixaban to heparin infusion Indication: atrial fibrillation  Patient Measurements: Height: 5' 9.5" (176.5 cm) Weight: 73.6 kg (162 lb 4.1 oz) IBW/kg (Calculated) : 67.35  Vital Signs: Temp: 98.3 F (36.8 C) (03/04 0440) Temp Source: Oral (03/04 0440) BP: 133/76 (03/04 0440) Pulse Rate: 102 (03/04 0440)  Labs: Recent Labs    09/19/20 0945 09/20/20 0407 09/20/20 1210 09/20/20 2105 09/21/20 0558  HGB 13.3 12.3  --   --  12.5  HCT 40.3 38.7  --   --  38.9  PLT 404* 343  --   --  363  APTT  --   --  33 122* 57*  CREATININE 3.08* 2.97*  --   --  2.69*    Estimated Creatinine Clearance: 18.9 mL/min (A) (by C-G formula based on SCr of 2.69 mg/dL (H)).   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 Pharmacy consulted for the transition of Eliquis to heparin. Her last Eliquis dose 09/19/20 1833   Date Time HL/aPTT Rate/Comment 3/03 2105 122s  3/04 0558 57s SUBtherapeutic on 850 units/hr   Goals of Therapy:  aPTT 66 - 102 seconds Monitor platelets by anticoagulation protocol: Yes  Plan:   aPTT SUBtherapeutic, will increase rate to 950 units/hr   Check aPTT 8 hours after increase  Check anti-Xa level once daily: when aPTT and anti-Xa level correlate we will use anti-Xa levels to guide infusion rates  Repeat CBC in am  Hart Robinsons A 09/21/2020,7:04 AM

## 2020-09-21 NOTE — Progress Notes (Signed)
[facility] Cardiology    SUBJECTIVE: The patient has no complaints this morning. She denies chest pain, shortness of breath, palpitations, heart racing, or abdominal pain.    Vitals:   09/20/20 2009 09/21/20 0105 09/21/20 0440 09/21/20 0722  BP: (!) 157/82 124/81 133/76 114/88  Pulse: 88 89 (!) 102 (!) 101  Resp: 16 18 19 17   Temp: 98.1 F (36.7 C) 98.1 F (36.7 C) 98.3 F (36.8 C) 98.1 F (36.7 C)  TempSrc: Oral  Oral   SpO2: 94% 92% 92% 91%  Weight:   73.6 kg   Height:         Intake/Output Summary (Last 24 hours) at 09/21/2020 8546 Last data filed at 09/21/2020 0636 Gross per 24 hour  Intake 578.07 ml  Output 650 ml  Net -71.93 ml      PHYSICAL EXAM  General: Well developed, well nourished, in no acute distress, lying on her side nearly flat in bed HEENT:  Normocephalic and atramatic Neck:  No JVD.  Lungs:  Normal effort of breathing on room air. Heart: irregularly irregular without gallops or murmurs.  Msk:  No obvious deformities Extremities: No clubbing, cyanosis or edema.   Neuro: Alert and oriented X 3. Psych:  Good affect, responds appropriately   LABS: Basic Metabolic Panel: Recent Labs    09/20/20 0407 09/21/20 0558  NA 141 141  K 4.8 4.0  CL 107 107  CO2 25 24  GLUCOSE 104* 104*  BUN 30* 25*  CREATININE 2.97* 2.69*  CALCIUM 9.1 8.8*   Liver Function Tests: Recent Labs    09/19/20 0945  AST 25  ALT 21  ALKPHOS 49  BILITOT 0.8  PROT 7.2  ALBUMIN 3.8   Recent Labs    09/19/20 0945  LIPASE 35   CBC: Recent Labs    09/20/20 0407 09/21/20 0558  WBC 10.3 11.3*  HGB 12.3 12.5  HCT 38.7 38.9  MCV 97.2 96.5  PLT 343 363   Cardiac Enzymes: No results for input(s): CKTOTAL, CKMB, CKMBINDEX, TROPONINI in the last 72 hours. BNP: Invalid input(s): POCBNP D-Dimer: No results for input(s): DDIMER in the last 72 hours. Hemoglobin A1C: No results for input(s): HGBA1C in the last 72 hours. Fasting Lipid Panel: No results for input(s):  CHOL, HDL, LDLCALC, TRIG, CHOLHDL, LDLDIRECT in the last 72 hours. Thyroid Function Tests: No results for input(s): TSH, T4TOTAL, T3FREE, THYROIDAB in the last 72 hours.  Invalid input(s): FREET3 Anemia Panel: No results for input(s): VITAMINB12, FOLATE, FERRITIN, TIBC, IRON, RETICCTPCT in the last 72 hours.  CT ABDOMEN PELVIS WO CONTRAST  Result Date: 09/19/2020 CLINICAL DATA:  Left lower quadrant pain. EXAM: CT ABDOMEN AND PELVIS WITHOUT CONTRAST TECHNIQUE: Multidetector CT imaging of the abdomen and pelvis was performed following the standard protocol without IV contrast. COMPARISON:  03/20/2005 FINDINGS: Lower chest: Small right pleural effusion.  No confluent opacities. Hepatobiliary: No focal hepatic abnormality. Gallbladder unremarkable. Pancreas: No focal abnormality or ductal dilatation. Spleen: No focal abnormality.  Normal size. Adrenals/Urinary Tract: Bilateral hydronephrosis. No renal or ureteral stones. The ureters are dilated to the urinary bladder which is decompressed. Adrenal glands unremarkable. Stomach/Bowel: Colonic diverticulosis diffusely. No active diverticulitis. Stomach and small bowel decompressed, unremarkable. Vascular/Lymphatic: Aortic atherosclerosis. No evidence of aneurysm or adenopathy. Reproductive: Uterus and adnexa unremarkable.  No mass. Other: Free fluid noted in the pelvis anterior to the uterus. No free air. Musculoskeletal: No acute bony abnormality. IMPRESSION: Diffuse colonic diverticulosis.  No active diverticulitis. Bilateral hydronephrosis of unknown etiology. Ureters are  dilated to the bladder. No visible stones. Bladder is decompressed. Small amount of free fluid in the pelvis. Aortic atherosclerosis. Small right pleural effusion. Electronically Signed   By: Rolm Baptise M.D.   On: 09/19/2020 10:44    TELEMETRY: atrial fibrillation, 80s-90s  ASSESSMENT AND PLAN:  Principal Problem:   Severe recurrent major depression without psychotic features  ([place]) Active Problems:   Alcohol abuse   Cardiomyopathy ([place])   AKI (acute kidney injury) ([place])   HFrEF (heart failure with reduced ejection fraction) ([place])   Hydronephrosis   Suicidal ideation   Somatic symptom disorder   Atrial fibrillation with rapid ventricular response ([place])   1. Atrial fibrillation with RVR in the setting of AKI and bilateral hydronephrosis, with known history of paroxysmal atrial fibrillation on home Eliquis and metoprolol. Currently on Cardizem drip with controlled ventricular rate. Heparin drip started to bridge to surgery Monday. 2. Bilateral hydronephrosis, urology consulted, concerned about possible bladder mass causing obstruction. Planning for cystoscopy and bilateral retrograde pyelograms with possible TURBT on Monday.  3. 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 Appears euvolemic.  Recommendations: 1. Agree with current therapy 2. Continue to hold Eliquis, continue heparin drip, which can be discontinued if hematuria worsens 3. Continue metoprolol succinate 100 mg for rate control 4. Discontinue Cardizem drip and transition to diltiazem 60 mg every 6 hours.      Clabe Seal, PA-C 09/21/2020 8:19 AM

## 2020-09-21 NOTE — Consult Note (Signed)
[place] for transition from apixaban to heparin infusion Indication: atrial fibrillation  Patient Measurements: Height: 5' 9.5" (176.5 cm) Weight: 73.6 kg (162 lb 4.1 oz) IBW/kg (Calculated) : 67.35  Vital Signs: Temp: 97.8 F (36.6 C) (03/04 1517) Temp Source: Oral (03/04 1121) BP: 110/74 (03/04 1517) Pulse Rate: 109 (03/04 1517)  Labs: Recent Labs    09/19/20 0945 09/20/20 0407 09/20/20 1210 09/20/20 2105 09/21/20 0558 09/21/20 1531  HGB 13.3 12.3  --   --  12.5  --   HCT 40.3 38.7  --   --  38.9  --   PLT 404* 343  --   --  363  --   APTT  --   --    < > 122* 57* 82*  HEPARINUNFRC  --   --   --   --  3.20*  --   CREATININE 3.08* 2.97*  --   --  2.69*  --    < > = values in this interval not displayed.    Estimated Creatinine Clearance: 18.9 mL/min (A) (by C-G formula based on SCr of 2.69 mg/dL (H)).   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 Pharmacy consulted for the transition of Eliquis to heparin. Her last Eliquis dose 09/19/20 1833   Date Time HL/aPTT Rate/Comment 3/03 2105 122s  3/04 0558 57s SUBtherapeutic on 850 units/hr, increased rate to 950 units/hr   3/4 1531 82s Therapeutic x 1 @ 950 units  Goals of Therapy:  aPTT 66 - 102 seconds Monitor platelets by anticoagulation protocol: Yes  Plan:   aPTT therapeutic x 1 at current heparin rate of 950 unit/hr. Will continue at current rate  Re-check aPTT 8 hours to confirm  Check anti-Xa level once daily: when aPTT and anti-Xa level correlate we will use anti-Xa levels to guide infusion rates  Repeat CBC in am  Dorothe Pea, PharmD, BCPS Clinical Pharmacist  09/21/2020,5:54 PM

## 2020-09-22 DIAGNOSIS — F332 Major depressive disorder, recurrent severe without psychotic features: Secondary | ICD-10-CM | POA: Diagnosis not present

## 2020-09-22 LAB — APTT
aPTT: 86 seconds — ABNORMAL HIGH (ref 24–36)
aPTT: 86 seconds — ABNORMAL HIGH (ref 24–36)

## 2020-09-22 LAB — BASIC METABOLIC PANEL
Anion gap: 12 (ref 5–15)
BUN: 27 mg/dL — ABNORMAL HIGH (ref 8–23)
CO2: 21 mmol/L — ABNORMAL LOW (ref 22–32)
Calcium: 8.9 mg/dL (ref 8.9–10.3)
Chloride: 105 mmol/L (ref 98–111)
Creatinine, Ser: 2.76 mg/dL — ABNORMAL HIGH (ref 0.44–1.00)
GFR, Estimated: 17 mL/min — ABNORMAL LOW (ref >60)
Glucose, Bld: 91 mg/dL (ref 70–99)
Potassium: 4.2 mmol/L (ref 3.5–5.1)
Sodium: 138 mmol/L (ref 135–145)

## 2020-09-22 LAB — HEPARIN LEVEL (UNFRACTIONATED): Heparin Unfractionated: 2.16 IU/mL — ABNORMAL HIGH (ref 0.30–0.70)

## 2020-09-22 LAB — CBC
HCT: 41.1 % (ref 36.0–46.0)
Hemoglobin: 13.2 g/dL (ref 12.0–15.0)
MCH: 31.1 pg (ref 26.0–34.0)
MCHC: 32.1 g/dL (ref 30.0–36.0)
MCV: 96.7 fL (ref 80.0–100.0)
Platelets: 322 10*3/uL (ref 150–400)
RBC: 4.25 MIL/uL (ref 3.87–5.11)
RDW: 12.5 % (ref 11.5–15.5)
WBC: 9.7 10*3/uL (ref 4.0–10.5)
nRBC: 0 % (ref 0.0–0.2)

## 2020-09-22 MED ORDER — POLYETHYLENE GLYCOL 3350 17 G PO PACK
17.0000 g | PACK | Freq: Every day | ORAL | Status: DC
  Administered 2020-09-22 – 2020-09-27 (×4): 17 g via ORAL
  Filled 2020-09-22 (×5): qty 1

## 2020-09-22 NOTE — Progress Notes (Signed)
PROGRESS NOTE    Gloria Gill  VEL:[id number] DOB: August 25, 1945 DOA: 09/19/2020 PCP: [name], MD  Outpatient Specialists: [facility]    Brief Narrative:   Gloria Gill is a 75 y.o. female with medical history significant for etoh (sober 3 years), a fib on eliquis, etoh cardiomyopathy, who presents with the above.  Few weeks of crampy suprapubic pain. Seen by pcp a week or so ago, clinical dx diverticulitis, started on augmentin. Didn't improved. Labs at f/u showed aki so referred to ED. Patient reports she was started on oxybutynin 3 weeks ago for urinary incontinence. No diarrhea or vomiting. No chest pain. No dysuria or fevers. Is making urine but thinks urinating less. No history kidney stones. Denies hematuria.  Told ED provider she's thinking of harming herself. Endorses SI and says plan is to drive into something with her car. 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 Denies alcohol or drug use.   Assessment & Plan:   Principal Problem:   Severe recurrent major depression without psychotic features ([place]) Active Problems:   Alcohol abuse   Cardiomyopathy ([place])   AKI (acute kidney injury) ([place])   HFrEF (heart failure with reduced ejection fraction) ([place])   Hydronephrosis   Suicidal ideation   Somatic symptom disorder   Atrial fibrillation with rapid ventricular response ([place])   # AKI # Bilateral hydronephrosis # Hematuria This appears to be a post-renal AKI. Cr 3.08 from normal previously. Cr 2.79 today, stable. CT does not show cause of obstruction. Urology following, ddx includes bladder dysfunction, malignancy, infection. K and bicarb wnl. New hematuria resolved. h/h stable. Pain resolved. Urine culture neg.  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 Per cardiology record most recent EF 40%. Appears compensated - hold lasix and losartan as above - cont home metoprolol  # Paroxysmal a fib # RVR rvr resolved with cardizem gtt, now off - hold eliquis as above, started heparin - cont home metop - cont cardizem oral which is new - cardiology following   DVT prophylaxis: heparin Code Status: full Family Communication:  None @ bedside  Level of care: Progressive Cardiac Status is: Inpatient  Remains inpatient appropriate because:Inpatient level of care appropriate due to severity of illness   Dispo: The patient is from: Home              Anticipated d/c is to: Home              Patient currently is not medically stable to d/c.   Difficult to place patient No        Consultants:  Urology, psychiatry, cardiology  Procedures: none  Antimicrobials:  none    Subjective: Has appetite, abd pain resolved. No overnight events. Hematuria resolved  Objective: Vitals:   09/21/20 2333 09/22/20 0112 09/22/20 0442 09/22/20 0758  BP: 132/81  132/66 (!) 112/56  Pulse: 91  74 67  Resp: 16  18 18   Temp: 97.7 F (36.5 C)  98.5 F (36.9 C) 98 F (36.7 C)  TempSrc: Oral   Oral  SpO2: 92%  97% 95%  Weight:  75.3 kg    Height:  Intake/Output Summary (Last 24 hours) at 09/22/2020 0910 Last data filed at 09/22/2020 0730 Gross per 24 hour  Intake 780.29 ml  Output 2225 ml  Net -1444.71 ml   Filed Weights   09/19/20 0948 09/21/20 0440 09/22/20 0112  Weight: 72.6 kg 73.6 kg 75.3 kg    Examination:  Constitutional: No acute distress Head: Atraumatic Eyes: Conjunctiva clear ENM: Moist mucous membranes.  Neck: Supple Respiratory: Clear to auscultation bilaterally, no wheezing/rales/rhonchi. Normal respiratory effort. No accessory muscle use.  . Cardiovascular: Regular rate and rhythm. No murmurs/rubs/gallops. Abdomen: soft, non-tender. No masses. No rebound or guarding. Positive bowel sounds. Musculoskeletal: No joint deformity upper and lower extremities. Normal ROM, no contractures. Normal muscle tone.  Skin: No rashes, lesions, or ulcers.  Extremities: No peripheral edema. Palpable peripheral pulses. Neurologic: Alert, moving all 4 extremities. Psychiatric: Normal insight and judgement.    Data Reviewed: I have personally reviewed following labs and imaging studies  CBC: Recent Labs  Lab 09/19/20 0945 09/20/20 0407 09/21/20 0558 09/22/20 0425  WBC 12.7* 10.3 11.3* 9.7  HGB 13.3 12.3 12.5 13.2  HCT 40.3 38.7 38.9 41.1  MCV 93.9 97.2 96.5 96.7  PLT 404* 343 363 324   Basic Metabolic Panel: Recent Labs  Lab 09/19/20 0945 09/20/20 0407 09/21/20 0558 09/22/20 0425  NA 138 141 141 138  K 4.1 4.8 4.0 4.2  CL 104 107 107 105  CO2 20* 25 24 21*  GLUCOSE 210* 104* 104* 91  BUN 33* 30* 25* 27*  CREATININE 3.08* 2.97* 2.69* 2.76*  CALCIUM 9.2 9.1 8.8* 8.9   GFR: Estimated Creatinine Clearance: 18.5 mL/min (A) (by C-G formula based on SCr of 2.76 mg/dL (H)). Liver Function Tests: Recent Labs  Lab 09/19/20 0945  AST 25  ALT 21  ALKPHOS 49  BILITOT 0.8  PROT 7.2  ALBUMIN 3.8   Recent Labs  Lab 09/19/20 0945  LIPASE 35   No results for input(s): AMMONIA in the last 168 hours. Coagulation Profile: No results for input(s): INR, PROTIME in the last 168 hours. Cardiac Enzymes: No results for input(s): CKTOTAL, CKMB, CKMBINDEX, TROPONINI in the last 168 hours. BNP (last 3 results) No results for input(s): PROBNP in the last 8760 hours. HbA1C: No results for input(s): HGBA1C in the last 72 hours. CBG: No results for input(s): GLUCAP in the last 168 hours. Lipid Profile: No results for input(s): CHOL, HDL, LDLCALC, TRIG, CHOLHDL, LDLDIRECT in the last 72 hours. Thyroid Function Tests: No results for  input(s): TSH, T4TOTAL, FREET4, T3FREE, THYROIDAB in the last 72 hours. Anemia Panel: No results for input(s): VITAMINB12, FOLATE, FERRITIN, TIBC, IRON, RETICCTPCT in the last 72 hours. Urine analysis:    Component Value Date/Time   COLORURINE YELLOW (A) 09/19/2020 1501   APPEARANCEUR HAZY (A) 09/19/2020 1501   LABSPEC 1.010 09/19/2020 1501   PHURINE 5.0 09/19/2020 1501   GLUCOSEU NEGATIVE 09/19/2020 1501   HGBUR MODERATE (A) 09/19/2020 1501   BILIRUBINUR NEGATIVE 09/19/2020 1501   KETONESUR NEGATIVE 09/19/2020 1501   PROTEINUR 30 (A) 09/19/2020 1501   NITRITE NEGATIVE 09/19/2020 1501   LEUKOCYTESUR MODERATE (A) 09/19/2020 1501   Sepsis Labs: @LABRCNTIP (procalcitonin:4,lacticidven:4)  ) Recent Results (from the past 240 hour(s))  SARS CORONAVIRUS 2 (TAT 6-24 HRS) Nasopharyngeal Nasopharyngeal Swab     Status: None   Collection Time: 09/19/20 12:08 PM   Specimen: Nasopharyngeal Swab  Result Value Ref Range Status   SARS Coronavirus 2 NEGATIVE NEGATIVE Final    Comment: (NOTE) SARS-CoV-2 target nucleic acids are  NOT DETECTED.  The SARS-CoV-2 RNA is generally detectable in upper and lower respiratory specimens during the acute phase of infection. Negative results do not preclude SARS-CoV-2 infection, do not rule out co-infections with other pathogens, and should not be used as the sole basis for treatment or other patient management decisions. Negative results must be combined with clinical observations, patient history, and epidemiological information. The expected result is Negative.  Fact Sheet for Patients: SugarRoll.be  Fact Sheet for Healthcare Providers: https://www.woods-mathews.com/  This test is not yet approved or cleared by the [place] FDA and  has been authorized for detection and/or diagnosis of SARS-CoV-2 by FDA under an Emergency Use Authorization (EUA). This EUA will remain  in effect (meaning this test can  be used) for the duration of the COVID-19 declaration under Se ction 564(b)(1) of the Act, 21 U.S.C. section 360bbb-3(b)(1), unless the authorization is terminated or revoked sooner.  Performed at [facility], [place] [address], [place], [place] [address]   Urine Culture     Status: Abnormal   Collection Time: 09/19/20  4:33 PM   Specimen: Urine, Random  Result Value Ref Range Status   Specimen Description   Final    URINE, RANDOM Performed at [facility], [address], [place], [place] [address]    Special Requests   Final    NONE Performed at [facility], [address] [place], [place] [address]    Culture (A)  Final    <10,000 COLONIES/mL INSIGNIFICANT GROWTH Performed at [facility], [place] [address] [place], [place] [address]    Report Status 09/21/2020 FINAL  Final         Radiology Studies: No results found.      Scheduled Meds: . Chlorhexidine Gluconate Cloth  6 each Topical Daily  . diltiazem  60 mg Oral Q6H  . metoprolol succinate  100 mg Oral Daily  . mirtazapine  15 mg Oral QHS  . sodium chloride flush  3 mL Intravenous Q12H   Continuous Infusions: . sodium chloride    . heparin 950 Units/hr (09/22/20 0730)     LOS: 3 days    Time spent: 25 min    Desma Maxim, MD [facility]   If 7PM-7AM, please contact night-coverage [email or web address] Password [email or web address] 09/22/2020, 9:10 AM

## 2020-09-22 NOTE — Consult Note (Signed)
[place] for transition from apixaban to heparin infusion Indication: atrial fibrillation  Patient Measurements: Height: 5' 9.5" (176.5 cm) Weight: 75.3 kg (166 lb 1.6 oz) IBW/kg (Calculated) : 67.35  Vital Signs: Temp: 98.5 F (36.9 C) (03/05 0442) Temp Source: Oral (03/04 2333) BP: 132/66 (03/05 0442) Pulse Rate: 74 (03/05 0442)  Labs: Recent Labs    09/20/20 0407 09/20/20 1210 09/21/20 0558 09/21/20 1531 09/21/20 2355 09/22/20 0425  HGB 12.3  --  12.5  --   --  13.2  HCT 38.7  --  38.9  --   --  41.1  PLT 343  --  363  --   --  322  APTT  --    < > 57* 82* 86* 86*  HEPARINUNFRC  --   --  3.20*  --   --  2.16*  CREATININE 2.97*  --  2.69*  --   --  2.76*   < > = values in this interval not displayed.    Estimated Creatinine Clearance: 18.5 mL/min (A) (by C-G formula based on SCr of 2.76 mg/dL (H)).   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 Pharmacy consulted for the transition of Eliquis to heparin. Her last Eliquis dose 09/19/20 1833   Date Time HL/aPTT Rate/Comment 3/03 2105 122s  3/04 0558 57s SUBtherapeutic on 850 units/hr, increased rate to 950 units/hr   3/4 1531 82s Therapeutic x 1 @ 950 units 3/4       2355    86s      Therapeutic X 2  3/5       0425    86s      Therapeutic X 3   Goals of Therapy:  aPTT 66 - 102 seconds Monitor platelets by anticoagulation protocol: Yes  Plan:  3/5 @ 0425:  APTT = 86 sec, HL = 2.16 APTT is therapeutic X 3 but HL remains elevated.   Will continue to follow aPTT and HL daily until both are therapeutic.   Orene Desanctis, PharmD Clinical Pharmacist  09/22/2020,6:57 AM

## 2020-09-22 NOTE — Consult Note (Signed)
[place] for transition from apixaban to heparin infusion Indication: atrial fibrillation  Patient Measurements: Height: 5' 9.5" (176.5 cm) Weight: 73.6 kg (162 lb 4.1 oz) IBW/kg (Calculated) : 67.35  Vital Signs: Temp: 97.7 F (36.5 C) (03/04 2333) Temp Source: Oral (03/04 2333) BP: 132/81 (03/04 2333) Pulse Rate: 91 (03/04 2333)  Labs: Recent Labs    09/19/20 0945 09/20/20 0407 09/20/20 1210 09/21/20 0558 09/21/20 1531 09/21/20 2355  HGB 13.3 12.3  --  12.5  --   --   HCT 40.3 38.7  --  38.9  --   --   PLT 404* 343  --  363  --   --   APTT  --   --    < > 57* 82* 86*  HEPARINUNFRC  --   --   --  3.20*  --   --   CREATININE 3.08* 2.97*  --  2.69*  --   --    < > = values in this interval not displayed.    Estimated Creatinine Clearance: 18.9 mL/min (A) (by C-G formula based on SCr of 2.69 mg/dL (H)).   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 Pharmacy consulted for the transition of Eliquis to heparin. Her last Eliquis dose 09/19/20 1833   Date Time HL/aPTT Rate/Comment 3/03 2105 122s  3/04 0558 57s SUBtherapeutic on 850 units/hr, increased rate to 950 units/hr   3/4 1531 82s Therapeutic x 1 @ 950 units 3/4       2355    86s      Therapeutic X 2   Goals of Therapy:  aPTT 66 - 102 seconds Monitor platelets by anticoagulation protocol: Yes  Plan:  3/4:  APTT @ 2355 = 86 Will continue pt on current rate and recheck HL and aPTT on 3/5 with AM labs.   Orene Desanctis, PharmD Clinical Pharmacist  09/22/2020,12:39 AM

## 2020-09-22 NOTE — Progress Notes (Signed)
[facility] Cardiology  SUBJECTIVE: Patient laying in bed, denies chest pain, shortness of breath, palpitations or heart racing   Vitals:   09/21/20 2333 09/22/20 0112 09/22/20 0442 09/22/20 0758  BP: 132/81  132/66 (!) 112/56  Pulse: 91  74 67  Resp: 16  18 18   Temp: 97.7 F (36.5 C)  98.5 F (36.9 C) 98 F (36.7 C)  TempSrc: Oral   Oral  SpO2: 92%  97% 95%  Weight:  75.3 kg    Height:         Intake/Output Summary (Last 24 hours) at 09/22/2020 1121 Last data filed at 09/22/2020 1002 Gross per 24 hour  Intake 900.29 ml  Output 2225 ml  Net -1324.71 ml      PHYSICAL EXAM  General: Well developed, well nourished, in no acute distress HEENT:  Normocephalic and atramatic Neck:  No JVD.  Lungs: Clear bilaterally to auscultation and percussion. Heart: HRRR . Normal S1 and S2 without gallops or murmurs.  Abdomen: Bowel sounds are positive, abdomen soft and non-tender  Msk:  Back normal, normal gait. Normal strength and tone for age. Extremities: No clubbing, cyanosis or edema.   Neuro: Alert and oriented X 3. Psych:  Good affect, responds appropriately   LABS: Basic Metabolic Panel: Recent Labs    09/21/20 0558 09/22/20 0425  NA 141 138  K 4.0 4.2  CL 107 105  CO2 24 21*  GLUCOSE 104* 91  BUN 25* 27*  CREATININE 2.69* 2.76*  CALCIUM 8.8* 8.9   Liver Function Tests: No results for input(s): AST, ALT, ALKPHOS, BILITOT, PROT, ALBUMIN in the last 72 hours. No results for input(s): LIPASE, AMYLASE in the last 72 hours. CBC: Recent Labs    09/21/20 0558 09/22/20 0425  WBC 11.3* 9.7  HGB 12.5 13.2  HCT 38.9 41.1  MCV 96.5 96.7  PLT 363 322   Cardiac Enzymes: No results for input(s): CKTOTAL, CKMB, CKMBINDEX, TROPONINI in the last 72 hours. BNP: Invalid input(s): POCBNP D-Dimer: No results for input(s): DDIMER in the last 72 hours. Hemoglobin A1C: No results for input(s): HGBA1C in the last 72 hours. Fasting Lipid Panel: No results for input(s): CHOL, HDL,  LDLCALC, TRIG, CHOLHDL, LDLDIRECT in the last 72 hours. Thyroid Function Tests: No results for input(s): TSH, T4TOTAL, T3FREE, THYROIDAB in the last 72 hours.  Invalid input(s): FREET3 Anemia Panel: No results for input(s): VITAMINB12, FOLATE, FERRITIN, TIBC, IRON, RETICCTPCT in the last 72 hours.  No results found.   Echo   TELEMETRY: Atrial fibrillation at 87 bpm:  ASSESSMENT AND PLAN:  Principal Problem:   Severe recurrent major depression without psychotic features ([place]) Active Problems:   Alcohol abuse   Cardiomyopathy ([place])   AKI (acute kidney injury) ([place])   HFrEF (heart failure with reduced ejection fraction) (HCC)   Hydronephrosis   Suicidal ideation   Somatic symptom disorder   Atrial fibrillation with rapid ventricular response ([place])    1. Atrial fibrillation with RVRin the setting of AKI and bilateral hydronephrosis, with known history of persistent atrial fibrillation on Eliquis and metoprolol succinate at home followed by Dr. Ubaldo Glassing.  Eliquis on hold, patient on heparin drip as bridge for possible surgery on 09/24/2020.  Cardizem drip, transition to diltiazem 60 mg p.o. every 6 for rate control. 2. Bilateral hydronephrosis,urology consulted, concerned about possible bladder mass causing obstruction. Plan for cystoscopy and bilateral retrograde pyelograms with possible TURBT on Monday.  3. 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 Appears euvolemic.  Recommendations  1.  Agree with overall current therapy 2.  Continue to hold Eliquis 3.  Continue heparin drip 4.  Continue metoprolol succinate and diltiazem for rate control 5.  Further recommendations pending urological procedures   Isaias Cowman, MD, PhD, [facility] 09/22/2020 11:21 AM

## 2020-09-23 DIAGNOSIS — F332 Major depressive disorder, recurrent severe without psychotic features: Secondary | ICD-10-CM | POA: Diagnosis not present

## 2020-09-23 LAB — CBC
HCT: 37.5 % (ref 36.0–46.0)
Hemoglobin: 12 g/dL (ref 12.0–15.0)
MCH: 30.8 pg (ref 26.0–34.0)
MCHC: 32 g/dL (ref 30.0–36.0)
MCV: 96.2 fL (ref 80.0–100.0)
Platelets: 341 10*3/uL (ref 150–400)
RBC: 3.9 MIL/uL (ref 3.87–5.11)
RDW: 12.5 % (ref 11.5–15.5)
WBC: 8.9 10*3/uL (ref 4.0–10.5)
nRBC: 0 % (ref 0.0–0.2)

## 2020-09-23 LAB — BASIC METABOLIC PANEL
Anion gap: 9 (ref 5–15)
BUN: 30 mg/dL — ABNORMAL HIGH (ref 8–23)
CO2: 24 mmol/L (ref 22–32)
Calcium: 8.7 mg/dL — ABNORMAL LOW (ref 8.9–10.3)
Chloride: 107 mmol/L (ref 98–111)
Creatinine, Ser: 2.93 mg/dL — ABNORMAL HIGH (ref 0.44–1.00)
GFR, Estimated: 16 mL/min — ABNORMAL LOW (ref >60)
Glucose, Bld: 89 mg/dL (ref 70–99)
Potassium: 4.2 mmol/L (ref 3.5–5.1)
Sodium: 140 mmol/L (ref 135–145)

## 2020-09-23 LAB — APTT
aPTT: 38 seconds — ABNORMAL HIGH (ref 24–36)
aPTT: 118 seconds — ABNORMAL HIGH (ref 24–36)
aPTT: 111 seconds — ABNORMAL HIGH (ref 24–36)

## 2020-09-23 LAB — HEPARIN LEVEL (UNFRACTIONATED): Heparin Unfractionated: 1.07 IU/mL — ABNORMAL HIGH (ref 0.30–0.70)

## 2020-09-23 MED ORDER — HEPARIN BOLUS VIA INFUSION
2200.0000 [IU] | Freq: Once | INTRAVENOUS | Status: AC
  Administered 2020-09-23: 2200 [IU] via INTRAVENOUS
  Filled 2020-09-23: qty 2200

## 2020-09-23 MED ORDER — DILTIAZEM HCL ER COATED BEADS 120 MG PO CP24
240.0000 mg | ORAL_CAPSULE | Freq: Every day | ORAL | Status: DC
  Administered 2020-09-23 – 2020-09-30 (×8): 240 mg via ORAL
  Filled 2020-09-23 (×8): qty 2

## 2020-09-23 NOTE — Plan of Care (Signed)
  Problem: Education: Goal: Knowledge of General Education information will improve Description: Including pain rating scale, medication(s)/side effects and non-pharmacologic comfort measures Outcome: Progressing   Problem: Health Behavior/Discharge Planning: Goal: Ability to manage health-related needs will improve Outcome: Progressing   Problem: Clinical Measurements: Goal: Ability to maintain clinical measurements within normal limits will improve Outcome: Progressing Goal: Will remain free from infection Outcome: Progressing Goal: Diagnostic test results will improve Outcome: Progressing Goal: Respiratory complications will improve Outcome: Progressing Goal: Cardiovascular complication will be avoided Outcome: Progressing   Problem: Activity: Goal: Risk for activity intolerance will decrease Outcome: Progressing   Problem: Nutrition: Goal: Adequate nutrition will be maintained Outcome: Progressing   Problem: Coping: Goal: Level of anxiety will decrease Outcome: Progressing   Problem: Elimination: Goal: Will not experience complications related to bowel motility Outcome: Progressing Goal: Will not experience complications related to urinary retention Outcome: Progressing   Problem: Pain Managment: Goal: General experience of comfort will improve Outcome: Progressing   Problem: Safety: Goal: Ability to remain free from injury will improve Outcome: Progressing   Problem: Skin Integrity: Goal: Risk for impaired skin integrity will decrease Outcome: Progressing    Pt is AAOx4. Pt denies any pain. VSS. Mews green. Foley intact. Foley care was done. Heparin bolus given this morning and rate was changed from 11 ml/hr to 12 ml/hr. Call light is within reach. Safety measures maintained. Will continue to monitor.

## 2020-09-23 NOTE — Progress Notes (Signed)
[facility] Cardiology  SUBJECTIVE: Patient laying in bed, denies chest pain, shortness of breath, palpitations, or heart racing   Vitals:   09/22/20 1649 09/22/20 2151 09/23/20 0317 09/23/20 0801  BP: 128/80 130/79 134/82 128/71  Pulse: 80 73 71 62  Resp: 18 20 16 16   Temp: 98.6 F (37 C) 97.6 F (36.4 C) 97.8 F (36.6 C) 98.1 F (36.7 C)  TempSrc: Oral Oral Oral Oral  SpO2: 96% 97% 97% 95%  Weight:   74.6 kg   Height:         Intake/Output Summary (Last 24 hours) at 09/23/2020 1043 Last data filed at 09/23/2020 0119 Gross per 24 hour  Intake 71.81 ml  Output 650 ml  Net -578.19 ml      PHYSICAL EXAM  General: Well developed, well nourished, in no acute distress HEENT:  Normocephalic and atramatic Neck:  No JVD.  Lungs: Clear bilaterally to auscultation and percussion. Heart: HRRR . Normal S1 and S2 without gallops or murmurs.  Abdomen: Bowel sounds are positive, abdomen soft and non-tender  Msk:  Back normal, normal gait. Normal strength and tone for age. Extremities: No clubbing, cyanosis or edema.   Neuro: Alert and oriented X 3. Psych:  Good affect, responds appropriately   LABS: Basic Metabolic Panel: Recent Labs    09/22/20 0425 09/23/20 0459  NA 138 140  K 4.2 4.2  CL 105 107  CO2 21* 24  GLUCOSE 91 89  BUN 27* 30*  CREATININE 2.76* 2.93*  CALCIUM 8.9 8.7*   Liver Function Tests: No results for input(s): AST, ALT, ALKPHOS, BILITOT, PROT, ALBUMIN in the last 72 hours. No results for input(s): LIPASE, AMYLASE in the last 72 hours. CBC: Recent Labs    09/22/20 0425 09/23/20 0459  WBC 9.7 8.9  HGB 13.2 12.0  HCT 41.1 37.5  MCV 96.7 96.2  PLT 322 341   Cardiac Enzymes: No results for input(s): CKTOTAL, CKMB, CKMBINDEX, TROPONINI in the last 72 hours. BNP: Invalid input(s): POCBNP D-Dimer: No results for input(s): DDIMER in the last 72 hours. Hemoglobin A1C: No results for input(s): HGBA1C in the last 72 hours. Fasting Lipid Panel: No results  for input(s): CHOL, HDL, LDLCALC, TRIG, CHOLHDL, LDLDIRECT in the last 72 hours. Thyroid Function Tests: No results for input(s): TSH, T4TOTAL, T3FREE, THYROIDAB in the last 72 hours.  Invalid input(s): FREET3 Anemia Panel: No results for input(s): VITAMINB12, FOLATE, FERRITIN, TIBC, IRON, RETICCTPCT in the last 72 hours.  No results found.   Echo   TELEMETRY: Atrial fibrillation at a rate of 87 bpm:  ASSESSMENT AND PLAN:  Principal Problem:   Severe recurrent major depression without psychotic features ([place]) Active Problems:   Alcohol abuse   Cardiomyopathy ([place])   AKI (acute kidney injury) ([place])   HFrEF (heart failure with reduced ejection fraction) (HCC)   Hydronephrosis   Suicidal ideation   Somatic symptom disorder   Atrial fibrillation with rapid ventricular response ([place])    1. Atrial fibrillation with RVRin the setting of AKI and bilateral hydronephrosis, with known history of persistent atrial fibrillation onEliquis and metoprolol succinate at home followed by Dr. Ubaldo Glassing.  Eliquis on hold, patient on heparin drip as bridge for possible surgery on 09/24/2020.  Cardizem drip, transition to diltiazem 60 mg p.o. every 6 for rate control. 2. Bilateral hydronephrosis,urology consulted, concerned about possible bladder mass causing obstruction. Plan for cystoscopy and bilateral retrograde pyelograms with possible TURBT on Monday. 3. 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 Appears euvolemic.  Recommendations  1.  Agree with overall current therapy 2.  Continue to hold Eliquis 3.  Continue heparin drip 4.  Continue metoprolol succinate  5.  Transition diltiazem 60 every 6 hours to Cardizem CD 240 mg daily 6.  Resume Eliquis following urological procedures 7.  Follow-up with Dr. Ubaldo Glassing as outpatient  Sign off for now, please call if any questions    Isaias Cowman, MD, PhD,  [facility] 09/23/2020 10:43 AM

## 2020-09-23 NOTE — Progress Notes (Signed)
Schedule for cystoscopy with possible biopsy/stent placement with Dr. Erlene Quan 09/24/2020.  N.p.o. after midnight orders placed

## 2020-09-23 NOTE — Progress Notes (Signed)
PROGRESS NOTE    Gloria Gill  QZE:[id number] DOB: 07-21-1946 DOA: 09/19/2020 PCP: [name], MD  Outpatient Specialists: [facility]    Brief Narrative:   Gloria Gill is a 75 y.o. female with medical history significant for etoh (sober 3 years), a fib on eliquis, etoh cardiomyopathy, who presents with the above.  Few weeks of crampy suprapubic pain. Seen by pcp a week or so ago, clinical dx diverticulitis, started on augmentin. Didn't improved. Labs at f/u showed aki so referred to ED. Patient reports she was started on oxybutynin 3 weeks ago for urinary incontinence. No diarrhea or vomiting. No chest pain. No dysuria or fevers. Is making urine but thinks urinating less. No history kidney stones. Denies hematuria.  Told ED provider she's thinking of harming herself. Endorses SI and says plan is to drive into something with her car. 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 Denies alcohol or drug use.   Assessment & Plan:   Principal Problem:   Severe recurrent major depression without psychotic features ([place]) Active Problems:   Alcohol abuse   Cardiomyopathy ([place])   AKI (acute kidney injury) ([place])   HFrEF (heart failure with reduced ejection fraction) ([place])   Hydronephrosis   Suicidal ideation   Somatic symptom disorder   Atrial fibrillation with rapid ventricular response ([place])   # AKI # Bilateral hydronephrosis # Hematuria This appears to be a post-renal AKI. Cr 3.08 from normal previously. Cr hovering around 3. CT does not show cause of obstruction. Urology following, ddx includes bladder dysfunction, malignancy, infection. K and bicarb wnl. New hematuria resolved. h/h stable. Pain resolved. Urine culture neg.  - maintain foley - hold home oxybutynin, lasix, losartan - monitor kidney function - hold eliquis, started heparin, plan for cystoscopy on Monday 3/7, will need to be npo night before, urology advises  stopping heparin then as well. Those orders placed - will likely need to involve nephrology prior to discharge if gfr doesn't improve, currently hovering around 15, no indication for dialysis currently  # Suicidal ideation - psych consulted, advised starting mirtazapine, have ordered  # Cardiomyopathy # Chronic hfref 2/2 alcohol. Per cardiology record most recent EF 40%. Appears compensated - hold lasix and losartan as above - cont home metoprolol  # Paroxysmal a fib # RVR rvr resolved with cardizem gtt, now off - hold eliquis as above, started heparin - cont home metop - cont cardizem oral which is new - cardiology will sign off   DVT prophylaxis: heparin Code Status: full Family Communication:  None @ bedside  Level of care: Med-Surg Status is: Inpatient  Remains inpatient appropriate because:Inpatient level of care appropriate due to severity of illness   Dispo: The patient is from: Home              Anticipated d/c is to: Home              Patient currently is not medically stable to d/c.   Difficult to place patient No        Consultants:  Urology, psychiatry, cardiology  Procedures: none  Antimicrobials:  none    Subjective: Has appetite, abd pain resolved. No overnight events.   Objective: Vitals:   09/22/20 1649 09/22/20 2151 09/23/20 0317 09/23/20 0801  BP: 128/80 130/79 134/82 128/71  Pulse: 80 73 71 62  Resp: 18 20 16 16   Temp: 98.6 F (37 C) 97.6 F (36.4 C) 97.8 F (36.6 C) 98.1 F (36.7 C)  TempSrc: Oral Oral Oral Oral  SpO2: 96% 97% 97% 95%  Weight:   74.6 kg   Height:        Intake/Output Summary (Last 24 hours) at 09/23/2020 1117 Last data filed at 09/23/2020 0119 Gross per 24 hour  Intake 71.81 ml  Output 650 ml  Net -578.19 ml   Filed Weights   09/21/20 0440 09/22/20 0112 09/23/20 0317  Weight: 73.6 kg 75.3 kg 74.6 kg    Examination:  Constitutional: No acute distress Head: Atraumatic Eyes: Conjunctiva  clear ENM: Moist mucous membranes.  Neck: Supple Respiratory: Clear to auscultation bilaterally, no wheezing/rales/rhonchi. Normal respiratory effort. No accessory muscle use. . Cardiovascular: Regular rate and rhythm. No murmurs/rubs/gallops. Abdomen: soft, non-tender. No masses. No rebound or guarding. Positive bowel sounds. Musculoskeletal: No joint deformity upper and lower extremities. Normal ROM, no contractures. Normal muscle tone.  Skin: No rashes, lesions, or ulcers.  Extremities: No peripheral edema. Palpable peripheral pulses. Neurologic: Alert, moving all 4 extremities. Psychiatric: Normal insight and judgement.    Data Reviewed: I have personally reviewed following labs and imaging studies  CBC: Recent Labs  Lab 09/19/20 0945 09/20/20 0407 09/21/20 0558 09/22/20 0425 09/23/20 0459  WBC 12.7* 10.3 11.3* 9.7 8.9  HGB 13.3 12.3 12.5 13.2 12.0  HCT 40.3 38.7 38.9 41.1 37.5  MCV 93.9 97.2 96.5 96.7 96.2  PLT 404* 343 363 322 413   Basic Metabolic Panel: Recent Labs  Lab 09/19/20 0945 09/20/20 0407 09/21/20 0558 09/22/20 0425 09/23/20 0459  NA 138 141 141 138 140  K 4.1 4.8 4.0 4.2 4.2  CL 104 107 107 105 107  CO2 20* 25 24 21* 24  GLUCOSE 210* 104* 104* 91 89  BUN 33* 30* 25* 27* 30*  CREATININE 3.08* 2.97* 2.69* 2.76* 2.93*  CALCIUM 9.2 9.1 8.8* 8.9 8.7*   GFR: Estimated Creatinine Clearance: 17.4 mL/min (A) (by C-G formula based on SCr of 2.93 mg/dL (H)). Liver Function Tests: Recent Labs  Lab 09/19/20 0945  AST 25  ALT 21  ALKPHOS 49  BILITOT 0.8  PROT 7.2  ALBUMIN 3.8   Recent Labs  Lab 09/19/20 0945  LIPASE 35   No results for input(s): AMMONIA in the last 168 hours. Coagulation Profile: No results for input(s): INR, PROTIME in the last 168 hours. Cardiac Enzymes: No results for input(s): CKTOTAL, CKMB, CKMBINDEX, TROPONINI in the last 168 hours. BNP (last 3 results) No results for input(s): PROBNP in the last 8760 hours. HbA1C: No  results for input(s): HGBA1C in the last 72 hours. CBG: No results for input(s): GLUCAP in the last 168 hours. Lipid Profile: No results for input(s): CHOL, HDL, LDLCALC, TRIG, CHOLHDL, LDLDIRECT in the last 72 hours. Thyroid Function Tests: No results for input(s): TSH, T4TOTAL, FREET4, T3FREE, THYROIDAB in the last 72 hours. Anemia Panel: No results for input(s): VITAMINB12, FOLATE, FERRITIN, TIBC, IRON, RETICCTPCT in the last 72 hours. Urine analysis:    Component Value Date/Time   COLORURINE YELLOW (A) 09/19/2020 1501   APPEARANCEUR HAZY (A) 09/19/2020 1501   LABSPEC 1.010 09/19/2020 1501   PHURINE 5.0 09/19/2020 1501   GLUCOSEU NEGATIVE 09/19/2020 1501   HGBUR MODERATE (A) 09/19/2020 1501   BILIRUBINUR NEGATIVE 09/19/2020 1501   KETONESUR NEGATIVE 09/19/2020 1501   PROTEINUR 30 (A) 09/19/2020 1501   NITRITE NEGATIVE 09/19/2020 1501   LEUKOCYTESUR MODERATE (A) 09/19/2020 1501   Sepsis Labs: @LABRCNTIP (procalcitonin:4,lacticidven:4)  ) Recent Results (from the past 240 hour(s))  SARS CORONAVIRUS 2 (TAT 6-24 HRS) Nasopharyngeal  Nasopharyngeal Swab     Status: None   Collection Time: 09/19/20 12:08 PM   Specimen: Nasopharyngeal Swab  Result Value Ref Range Status   SARS Coronavirus 2 NEGATIVE NEGATIVE Final    Comment: (NOTE) SARS-CoV-2 target nucleic acids are NOT DETECTED.  The SARS-CoV-2 RNA is generally detectable in upper and lower respiratory specimens during the acute phase of infection. Negative results do not preclude SARS-CoV-2 infection, do not rule out co-infections with other pathogens, and should not be used as the sole basis for treatment or other patient management decisions. Negative results must be combined with clinical observations, patient history, and epidemiological information. The expected result is Negative.  Fact Sheet for Patients: SugarRoll.be  Fact Sheet for Healthcare  Providers: https://www.woods-mathews.com/  This test is not yet approved or cleared by the [place] FDA and  has been authorized for detection and/or diagnosis of SARS-CoV-2 by FDA under an Emergency Use Authorization (EUA). This EUA will remain  in effect (meaning this test can be used) for the duration of the COVID-19 declaration under Se ction 564(b)(1) of the Act, 21 U.S.C. section 360bbb-3(b)(1), unless the authorization is terminated or revoked sooner.  Performed at [facility], [place] [address], [place], [place] [address]   Urine Culture     Status: Abnormal   Collection Time: 09/19/20  4:33 PM   Specimen: Urine, Random  Result Value Ref Range Status   Specimen Description   Final    URINE, RANDOM Performed at [facility], [address] [place], [place] [address]    Special Requests   Final    NONE Performed at [facility], [address] [place], [place] [address]    Culture (A)  Final    <10,000 COLONIES/mL INSIGNIFICANT GROWTH Performed at [facility], [place] [address] [place], [place] [address]    Report Status 09/21/2020 FINAL  Final         Radiology Studies: No results found.      Scheduled Meds: . Chlorhexidine Gluconate Cloth  6 each Topical Daily  . diltiazem  240 mg Oral Daily  . metoprolol succinate  100 mg Oral Daily  . mirtazapine  15 mg Oral QHS  . polyethylene glycol  17 g Oral Daily  . sodium chloride flush  3 mL Intravenous Q12H   Continuous Infusions: . sodium chloride    . heparin 1,200 Units/hr (09/23/20 0636)     LOS: 4 days    Time spent: 25 min    Desma Maxim, MD [facility]   If 7PM-7AM, please contact night-coverage [email or web address] Password [email or web address] 09/23/2020, 11:17 AM

## 2020-09-23 NOTE — Consult Note (Signed)
[place] for transition from apixaban to heparin infusion Indication: atrial fibrillation  Patient Measurements: Height: 5' 9.5" (176.5 cm) Weight: 74.6 kg (164 lb 8 oz) IBW/kg (Calculated) : 67.35  Vital Signs: Temp: 97.8 F (36.6 C) (03/06 0317) Temp Source: Oral (03/06 0317) BP: 134/82 (03/06 0317) Pulse Rate: 71 (03/06 0317)  Labs: Recent Labs    09/21/20 0558 09/21/20 1531 09/21/20 2355 09/22/20 0425 09/23/20 0459  HGB 12.5  --   --  13.2 12.0  HCT 38.9  --   --  41.1 37.5  PLT 363  --   --  322 341  APTT 57*   < > 86* 86* 38*  HEPARINUNFRC 3.20*  --   --  2.16* 1.07*  CREATININE 2.69*  --   --  2.76* 2.93*   < > = values in this interval not displayed.    Estimated Creatinine Clearance: 17.4 mL/min (A) (by C-G formula based on SCr of 2.93 mg/dL (H)).   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 Pharmacy consulted for the transition of Eliquis to heparin. Her last Eliquis dose 09/19/20 1833   Date Time HL/aPTT Rate/Comment 3/03 2105 122s  3/04 0558 57s SUBtherapeutic on 850 units/hr, increased rate to 950 units/hr   3/4 1531 82s Therapeutic x 1 @ 950 units 3/4       2355    86s      Therapeutic X 2  3/5       0425    86s      Therapeutic X 3  3/6       0459    38s      Subtherapeutic   Goals of Therapy:  aPTT 66 - 102 seconds Monitor platelets by anticoagulation protocol: Yes  Plan:  3/5 @ 0425:  APTT = 86 sec, HL = 2.16 APTT is therapeutic X 3 but HL remains elevated.   Will continue to follow aPTT and HL daily until both are therapeutic.   3/6 @ 0459:  APTT = 38 sec, HL = 1.07 APTT is subtherapeutic, HL still elevated.  Will order heparin 2200 units IV X 1 bolus and increase drip rate to 1200 units/hr.  Will recheck aPTT 8 hrs after rate change.  Will  recheck HL on 3/7 with AM labs.   Orene Desanctis, PharmD Clinical Pharmacist  09/23/2020,6:26 AM

## 2020-09-23 NOTE — Consult Note (Signed)
[place] for transition from apixaban to heparin infusion Indication: atrial fibrillation  Patient Measurements: Height: 5' 9.5" (176.5 cm) Weight: 74.6 kg (164 lb 8 oz) IBW/kg (Calculated) : 67.35  Vital Signs: Temp: 98.4 F (36.9 C) (03/06 1934) Temp Source: Oral (03/06 1934) BP: 128/75 (03/06 1934) Pulse Rate: 70 (03/06 1934)  Labs: Recent Labs    09/21/20 0558 09/21/20 1531 09/22/20 0425 09/23/20 0459 09/23/20 1456 09/23/20 2155  HGB 12.5  --  13.2 12.0  --   --   HCT 38.9  --  41.1 37.5  --   --   PLT 363  --  322 341  --   --   APTT 57*   < > 86* 38* 118* 111*  HEPARINUNFRC 3.20*  --  2.16* 1.07*  --   --   CREATININE 2.69*  --  2.76* 2.93*  --   --    < > = values in this interval not displayed.    Estimated Creatinine Clearance: 17.4 mL/min (A) (by C-G formula based on SCr of 2.93 mg/dL (H)).   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 Pharmacy consulted for the transition of Eliquis to heparin. Her last Eliquis dose 09/19/20 1833   Date Time HL/aPTT Rate/Comment 3/03 2105 122s  3/04 0558 57s SUBtherapeutic on 850 units/hr, increased rate to 950 units/hr   3/4 1531 82s Therapeutic x 1 @ 950 units 3/4       2355    86s      Therapeutic X 2  3/5       0425    86s      Therapeutic X 3  3/6       0459    38s      Subtherapeutic  3/6 1456    118 supratherapeutic 3/6       2155    111      supratherapeutic   Goals of Therapy:  aPTT 66 - 102 seconds Monitor platelets by anticoagulation protocol: Yes  Plan:   3/6:  APTT @ 2155 = 111 sec Will decrease heparin drip rate to 950 units/hr and recheck HL and aPTT 8 hrs after rate change.   Orene Desanctis, PharmD Clinical Pharmacist  09/23/2020,10:46 PM

## 2020-09-23 NOTE — Consult Note (Signed)
[place] for transition from apixaban to heparin infusion Indication: atrial fibrillation  Patient Measurements: Height: 5' 9.5" (176.5 cm) Weight: 74.6 kg (164 lb 8 oz) IBW/kg (Calculated) : 67.35  Vital Signs: Temp: 97.4 F (36.3 C) (03/06 1256) Temp Source: Oral (03/06 1256) BP: 147/86 (03/06 1256) Pulse Rate: 72 (03/06 1256)  Labs: Recent Labs    09/21/20 0558 09/21/20 1531 09/22/20 0425 09/23/20 0459 09/23/20 1456  HGB 12.5  --  13.2 12.0  --   HCT 38.9  --  41.1 37.5  --   PLT 363  --  322 341  --   APTT 57*   < > 86* 38* 118*  HEPARINUNFRC 3.20*  --  2.16* 1.07*  --   CREATININE 2.69*  --  2.76* 2.93*  --    < > = values in this interval not displayed.    Estimated Creatinine Clearance: 17.4 mL/min (A) (by C-G formula based on SCr of 2.93 mg/dL (H)).   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 Pharmacy consulted for the transition of Eliquis to heparin. Her last Eliquis dose 09/19/20 1833   Date Time HL/aPTT Rate/Comment 3/03 2105 122s  3/04 0558 57s SUBtherapeutic on 850 units/hr, increased rate to 950 units/hr   3/4 1531 82s Therapeutic x 1 @ 950 units 3/4       2355    86s      Therapeutic X 2  3/5       0425    86s      Therapeutic X 3  3/6       0459    38s      Subtherapeutic  3/6 1456    118 supratherapeutic  Goals of Therapy:  aPTT 66 - 102 seconds Monitor platelets by anticoagulation protocol: Yes  Plan:   3/6 @ 1456:  APTT = 118  supratherapeutic Will decrease drip rate to 1050 units/hr.  Will recheck aPTT 6 hrs after rate change( a little early).  F/u CBC w/ am labs  Noralee Space, PharmD Clinical Pharmacist  09/23/2020,3:30 PM

## 2020-09-24 ENCOUNTER — Encounter: Payer: Self-pay | Admitting: Obstetrics and Gynecology

## 2020-09-24 ENCOUNTER — Inpatient Hospital Stay: Payer: Medicare PPO

## 2020-09-24 ENCOUNTER — Inpatient Hospital Stay: Payer: Medicare PPO | Admitting: Anesthesiology

## 2020-09-24 ENCOUNTER — Encounter
Admission: EM | Disposition: A | Discharge status: Home / Self Care | Payer: Self-pay | Source: Home / Self Care | Attending: Obstetrics and Gynecology

## 2020-09-24 DIAGNOSIS — F332 Major depressive disorder, recurrent severe without psychotic features: Secondary | ICD-10-CM | POA: Diagnosis not present

## 2020-09-24 DIAGNOSIS — N135 Crossing vessel and stricture of ureter without hydronephrosis: Secondary | ICD-10-CM

## 2020-09-24 DIAGNOSIS — R310 Gross hematuria: Secondary | ICD-10-CM

## 2020-09-24 DIAGNOSIS — D494 Neoplasm of unspecified behavior of bladder: Secondary | ICD-10-CM

## 2020-09-24 DIAGNOSIS — N3289 Other specified disorders of bladder: Secondary | ICD-10-CM

## 2020-09-24 HISTORY — PX: TRANSURETHRAL RESECTION OF BLADDER TUMOR: SHX2575

## 2020-09-24 HISTORY — PX: CYSTOGRAM: SHX6285

## 2020-09-24 HISTORY — PX: CYSTOSCOPY WITH BIOPSY: SHX5122

## 2020-09-24 HISTORY — PX: CYSTOSCOPY WITH STENT PLACEMENT: SHX5790

## 2020-09-24 HISTORY — PX: CYSTOSCOPY W/ RETROGRADES: SHX1426

## 2020-09-24 LAB — APTT: aPTT: 31 seconds (ref 24–36)

## 2020-09-24 LAB — CBC
HCT: 38.8 % (ref 36.0–46.0)
Hemoglobin: 12.7 g/dL (ref 12.0–15.0)
MCH: 30.9 pg (ref 26.0–34.0)
MCHC: 32.7 g/dL (ref 30.0–36.0)
MCV: 94.4 fL (ref 80.0–100.0)
Platelets: 346 10*3/uL (ref 150–400)
RBC: 4.11 MIL/uL (ref 3.87–5.11)
RDW: 12.6 % (ref 11.5–15.5)
WBC: 9 10*3/uL (ref 4.0–10.5)
nRBC: 0 % (ref 0.0–0.2)

## 2020-09-24 LAB — BASIC METABOLIC PANEL
Anion gap: 10 (ref 5–15)
BUN: 30 mg/dL — ABNORMAL HIGH (ref 8–23)
CO2: 24 mmol/L (ref 22–32)
Calcium: 9.1 mg/dL (ref 8.9–10.3)
Chloride: 107 mmol/L (ref 98–111)
Creatinine, Ser: 3.16 mg/dL — ABNORMAL HIGH (ref 0.44–1.00)
GFR, Estimated: 15 mL/min — ABNORMAL LOW (ref >60)
Glucose, Bld: 90 mg/dL (ref 70–99)
Potassium: 5.1 mmol/L (ref 3.5–5.1)
Sodium: 141 mmol/L (ref 135–145)

## 2020-09-24 LAB — VANCOMYCIN, PEAK: Vancomycin Pk: <4 ug/mL — ABNORMAL LOW (ref 30–40)

## 2020-09-24 LAB — HEPARIN LEVEL (UNFRACTIONATED): Heparin Unfractionated: 0.75 IU/mL — ABNORMAL HIGH (ref 0.30–0.70)

## 2020-09-24 SURGERY — CYSTOSCOPY, WITH RETROGRADE PYELOGRAM
Anesthesia: General

## 2020-09-24 MED ORDER — ESMOLOL HCL 100 MG/10ML IV SOLN
INTRAVENOUS | Status: DC | PRN
  Administered 2020-09-24: 20 mg via INTRAVENOUS
  Administered 2020-09-24: 30 mg via INTRAVENOUS
  Administered 2020-09-24: 20 mg via INTRAVENOUS

## 2020-09-24 MED ORDER — SODIUM CHLORIDE 0.9 % IV SOLN: INTRAVENOUS | Status: AC

## 2020-09-24 MED ORDER — ROCURONIUM BROMIDE 100 MG/10ML IV SOLN
INTRAVENOUS | Status: DC | PRN
  Administered 2020-09-24: 10 mg via INTRAVENOUS
  Administered 2020-09-24: 30 mg via INTRAVENOUS

## 2020-09-24 MED ORDER — HYDRALAZINE HCL 20 MG/ML IJ SOLN
10.0000 mg | Freq: Once | INTRAMUSCULAR | Status: AC
  Administered 2020-09-24: 10 mg via INTRAVENOUS

## 2020-09-24 MED ORDER — PHENYLEPHRINE HCL (PRESSORS) 10 MG/ML IV SOLN
INTRAVENOUS | Status: DC | PRN
  Administered 2020-09-24 (×3): 100 ug via INTRAVENOUS
  Administered 2020-09-24: 200 ug via INTRAVENOUS

## 2020-09-24 MED ORDER — ROCURONIUM BROMIDE 10 MG/ML (PF) SYRINGE
PREFILLED_SYRINGE | INTRAVENOUS | Status: AC
  Filled 2020-09-24: qty 10

## 2020-09-24 MED ORDER — DEXAMETHASONE SODIUM PHOSPHATE 10 MG/ML IJ SOLN
INTRAMUSCULAR | Status: DC | PRN
  Administered 2020-09-24: 10 mg via INTRAVENOUS

## 2020-09-24 MED ORDER — FENTANYL CITRATE (PF) 100 MCG/2ML IJ SOLN
INTRAMUSCULAR | Status: AC
  Administered 2020-09-24: 25 ug via INTRAVENOUS
  Filled 2020-09-24: qty 2

## 2020-09-24 MED ORDER — HYDRALAZINE HCL 20 MG/ML IJ SOLN
INTRAMUSCULAR | Status: AC
  Filled 2020-09-24: qty 1

## 2020-09-24 MED ORDER — METOPROLOL TARTRATE 5 MG/5ML IV SOLN: 2.5000 mg | Freq: Once | INTRAVENOUS | Status: AC

## 2020-09-24 MED ORDER — IOHEXOL 180 MG/ML  SOLN
INTRAMUSCULAR | Status: DC | PRN
  Administered 2020-09-24: 30 mL

## 2020-09-24 MED ORDER — BLISTEX MEDICATED EX OINT
TOPICAL_OINTMENT | CUTANEOUS | Status: DC | PRN
  Filled 2020-09-24: qty 6.3

## 2020-09-24 MED ORDER — MIDAZOLAM HCL 2 MG/2ML IJ SOLN
INTRAMUSCULAR | Status: AC
  Administered 2020-09-24: 1 mg via INTRAVENOUS
  Filled 2020-09-24: qty 2

## 2020-09-24 MED ORDER — SUGAMMADEX SODIUM 200 MG/2ML IV SOLN
INTRAVENOUS | Status: DC | PRN
  Administered 2020-09-24: 200 mg via INTRAVENOUS

## 2020-09-24 MED ORDER — SENNOSIDES-DOCUSATE SODIUM 8.6-50 MG PO TABS
2.0000 | ORAL_TABLET | Freq: Every day | ORAL | Status: DC
  Administered 2020-09-24 – 2020-09-26 (×3): 2 via ORAL
  Filled 2020-09-24 (×3): qty 2

## 2020-09-24 MED ORDER — ONDANSETRON HCL 4 MG/2ML IJ SOLN
INTRAMUSCULAR | Status: AC
  Filled 2020-09-24: qty 2

## 2020-09-24 MED ORDER — MIDAZOLAM HCL 2 MG/2ML IJ SOLN: 1.0000 mg | Freq: Once | INTRAMUSCULAR | Status: AC

## 2020-09-24 MED ORDER — ESMOLOL HCL 100 MG/10ML IV SOLN
INTRAVENOUS | Status: AC
  Filled 2020-09-24: qty 10

## 2020-09-24 MED ORDER — LABETALOL HCL 5 MG/ML IV SOLN
INTRAVENOUS | Status: AC
  Filled 2020-09-24: qty 4

## 2020-09-24 MED ORDER — PROPOFOL 10 MG/ML IV BOLUS
INTRAVENOUS | Status: DC | PRN
  Administered 2020-09-24: 100 mg via INTRAVENOUS

## 2020-09-24 MED ORDER — FENTANYL CITRATE (PF) 100 MCG/2ML IJ SOLN
INTRAMUSCULAR | Status: AC
  Filled 2020-09-24: qty 2

## 2020-09-24 MED ORDER — METOPROLOL TARTRATE 5 MG/5ML IV SOLN
INTRAVENOUS | Status: AC
  Administered 2020-09-24: 2.5 mg via INTRAVENOUS
  Filled 2020-09-24: qty 5

## 2020-09-24 MED ORDER — CEFAZOLIN SODIUM-DEXTROSE 2-4 GM/100ML-% IV SOLN
INTRAVENOUS | Status: AC
  Filled 2020-09-24: qty 100

## 2020-09-24 MED ORDER — DEXAMETHASONE SODIUM PHOSPHATE 10 MG/ML IJ SOLN
INTRAMUSCULAR | Status: AC
  Filled 2020-09-24: qty 1

## 2020-09-24 MED ORDER — ONDANSETRON HCL 4 MG/2ML IJ SOLN: 4.0000 mg | Freq: Once | INTRAMUSCULAR | Status: DC | PRN

## 2020-09-24 MED ORDER — LABETALOL HCL 5 MG/ML IV SOLN
INTRAVENOUS | Status: AC
  Filled 2020-09-24: qty 8

## 2020-09-24 MED ORDER — LIDOCAINE HCL (PF) 2 % IJ SOLN
INTRAMUSCULAR | Status: AC
  Filled 2020-09-24: qty 5

## 2020-09-24 MED ORDER — LIDOCAINE HCL (CARDIAC) PF 100 MG/5ML IV SOSY
PREFILLED_SYRINGE | INTRAVENOUS | Status: DC | PRN
  Administered 2020-09-24: 100 mg via INTRAVENOUS

## 2020-09-24 MED ORDER — FENTANYL CITRATE (PF) 100 MCG/2ML IJ SOLN
25.0000 ug | INTRAMUSCULAR | Status: DC | PRN
  Administered 2020-09-24 (×3): 25 ug via INTRAVENOUS

## 2020-09-24 MED ORDER — LABETALOL HCL 5 MG/ML IV SOLN
10.0000 mg | Freq: Once | INTRAVENOUS | Status: AC
  Administered 2020-09-24: 10 mg via INTRAVENOUS

## 2020-09-24 MED ORDER — ONDANSETRON HCL 4 MG/2ML IJ SOLN
INTRAMUSCULAR | Status: DC | PRN
  Administered 2020-09-24: 4 mg via INTRAVENOUS

## 2020-09-24 MED ORDER — FENTANYL CITRATE (PF) 100 MCG/2ML IJ SOLN
INTRAMUSCULAR | Status: DC | PRN
  Administered 2020-09-24 (×4): 50 ug via INTRAVENOUS

## 2020-09-24 MED ORDER — CEFAZOLIN SODIUM-DEXTROSE 2-4 GM/100ML-% IV SOLN
2.0000 g | Freq: Once | INTRAVENOUS | Status: AC
  Administered 2020-09-24: 2 g via INTRAVENOUS

## 2020-09-24 SURGICAL SUPPLY — 40 items
BAG DRAIN CYSTO-URO LG1000N (MISCELLANEOUS) ×4 IMPLANT
BAG URINE DRAIN 2000ML AR STRL (UROLOGICAL SUPPLIES) ×4 IMPLANT
BRUSH SCRUB EZ  4% CHG (MISCELLANEOUS) ×1
BRUSH SCRUB EZ 1% IODOPHOR (MISCELLANEOUS) ×4 IMPLANT
BRUSH SCRUB EZ 4% CHG (MISCELLANEOUS) ×3 IMPLANT
CATH BEACON 5 .035 65 KMP TIP (CATHETERS) ×1 IMPLANT
CATH FOL 2WAY LX 18X5 (CATHETERS) ×1 IMPLANT
CATH FOLEY 2WAY  5CC 16FR (CATHETERS) ×1
CATH URETL 5X70 OPEN END (CATHETERS) ×4 IMPLANT
CATH URTH 16FR FL 2W BLN LF (CATHETERS) ×3 IMPLANT
DRAPE UTILITY 15X26 TOWEL STRL (DRAPES) ×4 IMPLANT
DRSG TELFA 4X3 1S NADH ST (GAUZE/BANDAGES/DRESSINGS) ×4 IMPLANT
ELECT LOOP 22F BIPOLAR SML (ELECTROSURGICAL) ×4
ELECT REM PT RETURN 9FT ADLT (ELECTROSURGICAL) ×4
ELECTRODE LOOP 22F BIPOLAR SML (ELECTROSURGICAL) IMPLANT
ELECTRODE REM PT RTRN 9FT ADLT (ELECTROSURGICAL) ×3 IMPLANT
GLIDEWIRE STIFF .35X180X3 HYDR (WIRE) ×1 IMPLANT
GLOVE SURG ENC MOIS LTX SZ6.5 (GLOVE) ×4 IMPLANT
GOWN STRL REUS W/ TWL LRG LVL3 (GOWN DISPOSABLE) ×6 IMPLANT
GOWN STRL REUS W/TWL LRG LVL3 (GOWN DISPOSABLE) ×2
GUIDEWIRE STR DUAL SENSOR (WIRE) ×4 IMPLANT
IV NS IRRIG 3000ML ARTHROMATIC (IV SOLUTION) ×2 IMPLANT
KIT TURNOVER CYSTO (KITS) ×4 IMPLANT
LOOP CUT BIPOLAR 24F LRG (ELECTROSURGICAL) IMPLANT
NDL SAFETY ECLIPSE 18X1.5 (NEEDLE) ×3 IMPLANT
NEEDLE HYPO 18GX1.5 SHARP (NEEDLE) ×1
PACK CYSTO AR (MISCELLANEOUS) ×4 IMPLANT
PAD ARMBOARD 7.5X6 YLW CONV (MISCELLANEOUS) ×4 IMPLANT
SET CYSTO W/LG BORE CLAMP LF (SET/KITS/TRAYS/PACK) ×4 IMPLANT
SET IRRIG Y TYPE TUR BLADDER L (SET/KITS/TRAYS/PACK) ×4 IMPLANT
SOL .9 NS 3000ML IRR  AL (IV SOLUTION) ×1
SOL .9 NS 3000ML IRR UROMATIC (IV SOLUTION) ×3 IMPLANT
STENT URET 6FRX24 CONTOUR (STENTS) IMPLANT
STENT URET 6FRX26 CONTOUR (STENTS) IMPLANT
STENT URO INLAY 6FRX24CM (STENTS) ×2 IMPLANT
SURGILUBE 2OZ TUBE FLIPTOP (MISCELLANEOUS) ×4 IMPLANT
SYR TOOMEY IRRIG 70ML (MISCELLANEOUS) ×4
SYRINGE TOOMEY IRRIG 70ML (MISCELLANEOUS) ×3 IMPLANT
WATER STERILE IRR 1000ML POUR (IV SOLUTION) ×4 IMPLANT
WATER STERILE IRR 3000ML UROMA (IV SOLUTION) ×5 IMPLANT

## 2020-09-24 NOTE — Progress Notes (Signed)
   09/24/20 0955  Clinical Encounter Type  Visited With Patient  Visit Type Initial;Psychological support;Spiritual support;Social support  Referral From Nurse  Consult/Referral To [place] responded to a page, which informed the Chaplain that Gloria Gill requested to speak to a chaplain. Chaplain provided spiritual and emotional support for the pt. PT was able to express her emotions, and stated she is battling heavily with depression. Chaplain engaged with reflective listening, a calming presence, and prayed with PT.

## 2020-09-24 NOTE — Progress Notes (Signed)
Notified Dr. Erlene Quan that patient had UA done 3/2 showing leukocytes, few bacteria on microscopic exam.  No antibiotic treatments seen in [facility].  Foley catheter now draining pale yellow with mucous.

## 2020-09-24 NOTE — Transfer of Care (Signed)
Immediate Anesthesia Transfer of Care Note  Patient: Gloria Gill  Procedure(s) Performed: CYSTOSCOPY WITH RETROGRADE PYELOGRAM (Bilateral ) CYSTOSCOPY WITH RIGHT STENT PLACEMENT (Bilateral ) TRANSURETHRAL RESECTION OF BLADDER TUMOR (TURBT) (N/A ) CYSTOSCOPY WITH BIOPSY (N/A ) CYSTOGRAM  Patient Location: PACU  Anesthesia Type:General  Level of Consciousness: awake  Airway & Oxygen Therapy: Patient connected to face mask oxygen  Post-op Assessment: Post -op Vital signs reviewed and stable  Post vital signs: stable  Last Vitals:  Vitals Value Taken Time  BP 156/105 09/24/20 1946  Temp    Pulse 115 09/24/20 1947  Resp 17 09/24/20 1948  SpO2 92 % 09/24/20 1947  Vitals shown include unvalidated device data.  Last Pain:  Vitals:   09/24/20 1436  TempSrc: Oral  PainSc: 0-No pain         Complications: No complications documented.

## 2020-09-24 NOTE — Progress Notes (Signed)
Patient requested for chaplain to visit to discuss life issues. Chaplain responded to room as well as Advertising copywriter. Patient expressed feelings of comfort and relief after speaking with staff. Felt very encouraged self reported.

## 2020-09-24 NOTE — Anesthesia Preprocedure Evaluation (Addendum)
Anesthesia Evaluation  Patient identified by MRN, date of birth, ID band Patient awake    Reviewed: Allergy & Precautions, NPO status , Patient's Chart, lab work & pertinent test results  Airway Mallampati: II  TM Distance: >3 FB     Dental   Pulmonary neg pulmonary ROS,    Pulmonary exam normal        Cardiovascular hypertension, +CHF  + dysrhythmias Atrial Fibrillation  Rhythm:Irregular     Neuro/Psych PSYCHIATRIC DISORDERS Depression negative neurological ROS     GI/Hepatic negative GI ROS, (+)     substance abuse  alcohol use,   Endo/Other  negative endocrine ROS  Renal/GU Renal disease     Musculoskeletal negative musculoskeletal ROS (+)   Abdominal Normal abdominal exam  (+)   Peds negative pediatric ROS (+)  Hematology negative hematology ROS (+)   Anesthesia Other Findings Past Medical History: No date: Atrial fibrillation (HCC) No date: Hypertension LVEF 40%  Reproductive/Obstetrics                            Anesthesia Physical Anesthesia Plan  ASA: III  Anesthesia Plan: General   Post-op Pain Management:    Induction: Intravenous  PONV Risk Score and Plan:   Airway Management Planned: Oral ETT  Additional Equipment:   Intra-op Plan:   Post-operative Plan: Extubation in OR  Informed Consent: I have reviewed the patients History and Physical, chart, labs and discussed the procedure including the risks, benefits and alternatives for the proposed anesthesia with the patient or authorized representative who has indicated his/her understanding and acceptance.     Dental advisory given  Plan Discussed with: CRNA and Surgeon  Anesthesia Plan Comments:         Anesthesia Quick Evaluation

## 2020-09-24 NOTE — Plan of Care (Signed)
Problem: Education: Goal: Knowledge of General Education information will improve Description: Including pain rating scale, medication(s)/side effects and non-pharmacologic comfort measures Outcome: Progressing   Problem: Health Behavior/Discharge Planning: Goal: Ability to manage health-related needs will improve Outcome: Progressing   Problem: Clinical Measurements: Goal: Ability to maintain clinical measurements within normal limits will improve Outcome: Progressing Goal: Will remain free from infection Outcome: Progressing Goal: Diagnostic test results will improve Outcome: Progressing Goal: Respiratory complications will improve Outcome: Progressing Goal: Cardiovascular complication will be avoided Outcome: Progressing   Problem: Activity: Goal: Risk for activity intolerance will decrease Outcome: Progressing   Problem: Nutrition: Goal: Adequate nutrition will be maintained Outcome: Progressing   Problem: Coping: Goal: Level of anxiety will decrease Outcome: Progressing   Problem: Elimination: Goal: Will not experience complications related to bowel motility Outcome: Progressing Goal: Will not experience complications related to urinary retention Outcome: Progressing   Problem: Pain Managment: Goal: General experience of comfort will improve Outcome: Progressing   Problem: Safety: Goal: Ability to remain free from injury will improve Outcome: Progressing   Problem: Skin Integrity: Goal: Risk for impaired skin integrity will decrease Outcome: Progressing   Problem: Education: Goal: Knowledge of General Education information will improve Description: Including pain rating scale, medication(s)/side effects and non-pharmacologic comfort measures Outcome: Progressing   Problem: Health Behavior/Discharge Planning: Goal: Ability to manage health-related needs will improve Outcome: Progressing   Problem: Clinical Measurements: Goal: Ability to maintain  clinical measurements within normal limits will improve Outcome: Progressing Goal: Will remain free from infection Outcome: Progressing Goal: Diagnostic test results will improve Outcome: Progressing Goal: Respiratory complications will improve Outcome: Progressing Goal: Cardiovascular complication will be avoided Outcome: Progressing   Problem: Activity: Goal: Risk for activity intolerance will decrease Outcome: Progressing   Problem: Nutrition: Goal: Adequate nutrition will be maintained Outcome: Progressing   Problem: Coping: Goal: Level of anxiety will decrease Outcome: Progressing   Problem: Elimination: Goal: Will not experience complications related to bowel motility Outcome: Progressing Goal: Will not experience complications related to urinary retention Outcome: Progressing   Problem: Pain Managment: Goal: General experience of comfort will improve Outcome: Progressing   Problem: Safety: Goal: Ability to remain free from injury will improve Outcome: Progressing   Problem: Skin Integrity: Goal: Risk for impaired skin integrity will decrease Outcome: Progressing   Problem: Education: Goal: Knowledge of General Education information will improve Description: Including pain rating scale, medication(s)/side effects and non-pharmacologic comfort measures Outcome: Progressing   Problem: Health Behavior/Discharge Planning: Goal: Ability to manage health-related needs will improve Outcome: Progressing   Problem: Clinical Measurements: Goal: Ability to maintain clinical measurements within normal limits will improve Outcome: Progressing Goal: Will remain free from infection Outcome: Progressing Goal: Diagnostic test results will improve Outcome: Progressing Goal: Respiratory complications will improve Outcome: Progressing Goal: Cardiovascular complication will be avoided Outcome: Progressing   Problem: Activity: Goal: Risk for activity intolerance will  decrease Outcome: Progressing   Problem: Nutrition: Goal: Adequate nutrition will be maintained Outcome: Progressing   Problem: Coping: Goal: Level of anxiety will decrease Outcome: Progressing   Problem: Elimination: Goal: Will not experience complications related to bowel motility Outcome: Progressing Goal: Will not experience complications related to urinary retention Outcome: Progressing   Problem: Pain Managment: Goal: General experience of comfort will improve Outcome: Progressing   Problem: Safety: Goal: Ability to remain free from injury will improve Outcome: Progressing   Problem: Skin Integrity: Goal: Risk for impaired skin integrity will decrease Outcome: Progressing     Pt is AAOx4. Denies any  pain. Pt's heparin drip was stopped MN last night due to procedure. Pt was not comfortable signing the consent. Pt has more questions about the procedure. Oncoming RN made aware. Pt's VSS. Pt afebrile. Foley is intact. Foley care was done. All pt's needs attended. Call light is within reach. Safety measures maintained.

## 2020-09-24 NOTE — Anesthesia Procedure Notes (Signed)
Procedure Name: Intubation Date/Time: 09/24/2020 5:57 PM Performed by: Leander Rams, CRNA Pre-anesthesia Checklist: Patient identified, Emergency Drugs available, Suction available, Patient being monitored and Timeout performed Patient Re-evaluated:Patient Re-evaluated prior to induction Oxygen Delivery Method: Circle system utilized Preoxygenation: Pre-oxygenation with 100% oxygen Induction Type: IV induction Ventilation: Mask ventilation without difficulty Laryngoscope Size: McGraph and 4 Grade View: Grade I Tube type: Oral Tube size: 7.0 mm Number of attempts: 1 Airway Equipment and Method: Stylet Placement Confirmation: ETT inserted through vocal cords under direct vision,  positive ETCO2,  CO2 detector and breath sounds checked- equal and bilateral Secured at: 21 cm Tube secured with: Tape Dental Injury: Teeth and Oropharynx as per pre-operative assessment

## 2020-09-24 NOTE — Interval H&P Note (Signed)
History and Physical Interval Note:  09/24/2020 5:38 PM  Derenda Mis Eddings  has presented today for surgery, with the diagnosis of hydronephrosis.  The various methods of treatment have been discussed with the patient and family. After consideration of risks, benefits and other options for treatment, the patient has consented to  Procedure(s): CYSTOSCOPY WITH RETROGRADE PYELOGRAM (Bilateral) CYSTOSCOPY WITH STENT PLACEMENT (Bilateral) TRANSURETHRAL RESECTION OF BLADDER TUMOR (TURBT) (N/A) CYSTOSCOPY WITH BIOPSY (N/A) as a surgical intervention.  The patient's history has been reviewed, patient examined, no change in status, stable for surgery.  I have reviewed the patient's chart and labs.  Questions were answered to the patient's satisfaction.     Hollice Espy  Patient seen and evaluated on day of surgery.  Continues to have AKI.  Plan for aforementioned surgery as scheduled.  All questions answered.  Regular rate and rhythm  Clear to auscultation bilaterally

## 2020-09-24 NOTE — Progress Notes (Signed)
PROGRESS NOTE    Gloria Gill  HAL:[id number] DOB: Jan 12, 1946 DOA: 09/19/2020 PCP: [name], MD  Outpatient Specialists: [facility]    Brief Narrative:   Gloria Gill is a 75 y.o. female with medical history significant for etoh (sober 3 years), a fib on eliquis, etoh cardiomyopathy, who presents with the above.  Few weeks of crampy suprapubic pain. Seen by pcp a week or so ago, clinical dx diverticulitis, started on augmentin. Didn't improved. Labs at f/u showed aki so referred to ED. Patient reports she was started on oxybutynin 3 weeks ago for urinary incontinence. No diarrhea or vomiting. No chest pain. No dysuria or fevers. Is making urine but thinks urinating less. No history kidney stones. Denies hematuria.  Told ED provider she's thinking of harming herself. Endorses SI and says plan is to drive into something with her car. 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 Denies alcohol or drug use.   Assessment & Plan:   Principal Problem:   Severe recurrent major depression without psychotic features ([place]) Active Problems:   Alcohol abuse   Cardiomyopathy ([place])   AKI (acute kidney injury) ([place])   HFrEF (heart failure with reduced ejection fraction) ([place])   Hydronephrosis   Suicidal ideation   Somatic symptom disorder   Atrial fibrillation with rapid ventricular response ([place])   # AKI # Bilateral hydronephrosis # Hematuria This appears to be a post-renal AKI. Cr 3.08 from normal previously. Cr hovering around 3. CT does not show cause of obstruction. Urology following, ddx includes bladder dysfunction, malignancy, infection. K and bicarb wnl. New hematuria resolved. h/h stable. Pain resolved. Urine culture neg.  - maintain foley - hold home oxybutynin, lasix, losartan - monitor kidney function - hold eliquis, started heparin which is held this AM for cystoscopy later today. 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 Per cardiology record most recent EF 40%. Appears compensated - hold lasix and losartan as above - cont home metoprolol  # Paroxysmal a fib # RVR rvr resolved with cardizem gtt, now off - hold eliquis as above, started heparin - cont home metop - cont cardizem oral which is new - cardiology has signed off  # Constipation No BM w miralax - cont miralax - add senna/docusate qhs   DVT prophylaxis: IV heparin currently off Code Status: full Family Communication:  None @ bedside  Level of care: Med-Surg Status is: Inpatient  Remains inpatient appropriate because:Inpatient level of care appropriate due to severity of illness   Dispo: The patient is from: Home              Anticipated d/c is to: Home              Patient currently is not medically stable to d/c.   Difficult to place patient No        Consultants:  Urology, psychiatry, cardiology  Procedures: none  Antimicrobials:  none    Subjective: Has appetite, abd pain resolved. No overnight events. npo  Objective: Vitals:   09/24/20 0100 09/24/20 0433 09/24/20 0738 09/24/20 0820  BP:  (!) 153/81 126/64 136/90  Pulse:  75 76 83  Resp:  17 17   Temp:  98.2 F (36.8 C)  98.6 F (37 C) 98 F (36.7 C)  TempSrc:    Oral  SpO2:  95% 96% 93%  Weight: 76.7 kg     Height:        Intake/Output Summary (Last 24 hours) at 09/24/2020 1055 Last data filed at 09/24/2020 0840 Gross per 24 hour  Intake 240 ml  Output 1985 ml  Net -1745 ml   Filed Weights   09/22/20 0112 09/23/20 0317 09/24/20 0100  Weight: 75.3 kg 74.6 kg 76.7 kg    Examination:  Constitutional: No acute distress Head: Atraumatic Eyes: Conjunctiva clear ENM:  Moist mucous membranes.  Neck: Supple Respiratory: Clear to auscultation bilaterally, no wheezing/rales/rhonchi. Normal respiratory effort. No accessory muscle use. . Cardiovascular: Regular rate and rhythm. No murmurs/rubs/gallops. Abdomen: soft, non-tender. No masses. No rebound or guarding. Positive bowel sounds. Musculoskeletal: No joint deformity upper and lower extremities. Normal ROM, no contractures. Normal muscle tone.  Skin: No rashes, lesions, or ulcers.  Extremities: No peripheral edema. Palpable peripheral pulses. Neurologic: Alert, moving all 4 extremities. Psychiatric: Normal insight and judgement.    Data Reviewed: I have personally reviewed following labs and imaging studies  CBC: Recent Labs  Lab 09/20/20 0407 09/21/20 0558 09/22/20 0425 09/23/20 0459 09/24/20 0718  WBC 10.3 11.3* 9.7 8.9 9.0  HGB 12.3 12.5 13.2 12.0 12.7  HCT 38.7 38.9 41.1 37.5 38.8  MCV 97.2 96.5 96.7 96.2 94.4  PLT 343 363 322 341 222   Basic Metabolic Panel: Recent Labs  Lab 09/20/20 0407 09/21/20 0558 09/22/20 0425 09/23/20 0459 09/24/20 0718  NA 141 141 138 140 141  K 4.8 4.0 4.2 4.2 5.1  CL 107 107 105 107 107  CO2 25 24 21* 24 24  GLUCOSE 104* 104* 91 89 90  BUN 30* 25* 27* 30* 30*  CREATININE 2.97* 2.69* 2.76* 2.93* 3.16*  CALCIUM 9.1 8.8* 8.9 8.7* 9.1   GFR: Estimated Creatinine Clearance: 16.1 mL/min (A) (by C-G formula based on SCr of 3.16 mg/dL (H)). Liver Function Tests: Recent Labs  Lab 09/19/20 0945  AST 25  ALT 21  ALKPHOS 49  BILITOT 0.8  PROT 7.2  ALBUMIN 3.8   Recent Labs  Lab 09/19/20 0945  LIPASE 35   No results for input(s): AMMONIA in the last 168 hours. Coagulation Profile: No results for input(s): INR, PROTIME in the last 168 hours. Cardiac Enzymes: No results for input(s): CKTOTAL, CKMB, CKMBINDEX, TROPONINI in the last 168 hours. BNP (last 3 results) No results for input(s): PROBNP in the last 8760 hours. HbA1C: No results for  input(s): HGBA1C in the last 72 hours. CBG: No results for input(s): GLUCAP in the last 168 hours. Lipid Profile: No results for input(s): CHOL, HDL, LDLCALC, TRIG, CHOLHDL, LDLDIRECT in the last 72 hours. Thyroid Function Tests: No results for input(s): TSH, T4TOTAL, FREET4, T3FREE, THYROIDAB in the last 72 hours. Anemia Panel: No results for input(s): VITAMINB12, FOLATE, FERRITIN, TIBC, IRON, RETICCTPCT in the last 72 hours. Urine analysis:    Component Value Date/Time   COLORURINE YELLOW (A) 09/19/2020 1501   APPEARANCEUR HAZY (A) 09/19/2020 1501   LABSPEC 1.010 09/19/2020 1501   PHURINE 5.0 09/19/2020 1501   GLUCOSEU NEGATIVE 09/19/2020 1501   HGBUR MODERATE (A) 09/19/2020 1501   BILIRUBINUR NEGATIVE 09/19/2020 1501   KETONESUR NEGATIVE 09/19/2020 1501   PROTEINUR 30 (A) 09/19/2020 1501   NITRITE NEGATIVE 09/19/2020 1501   LEUKOCYTESUR MODERATE (A) 09/19/2020 1501   Sepsis Labs: @LABRCNTIP (procalcitonin:4,lacticidven:4)  ) Recent Results (from the past 240  hour(s))  SARS CORONAVIRUS 2 (TAT 6-24 HRS) Nasopharyngeal Nasopharyngeal Swab     Status: None   Collection Time: 09/19/20 12:08 PM   Specimen: Nasopharyngeal Swab  Result Value Ref Range Status   SARS Coronavirus 2 NEGATIVE NEGATIVE Final    Comment: (NOTE) SARS-CoV-2 target nucleic acids are NOT DETECTED.  The SARS-CoV-2 RNA is generally detectable in upper and lower respiratory specimens during the acute phase of infection. Negative results do not preclude SARS-CoV-2 infection, do not rule out co-infections with other pathogens, and should not be used as the sole basis for treatment or other patient management decisions. Negative results must be combined with clinical observations, patient history, and epidemiological information. The expected result is Negative.  Fact Sheet for Patients: SugarRoll.be  Fact Sheet for Healthcare  Providers: https://www.woods-mathews.com/  This test is not yet approved or cleared by the [place] FDA and  has been authorized for detection and/or diagnosis of SARS-CoV-2 by FDA under an Emergency Use Authorization (EUA). This EUA will remain  in effect (meaning this test can be used) for the duration of the COVID-19 declaration under Se ction 564(b)(1) of the Act, 21 U.S.C. section 360bbb-3(b)(1), unless the authorization is terminated or revoked sooner.  Performed at [facility], [place] [address] [place], [place] [address]   Urine Culture     Status: Abnormal   Collection Time: 09/19/20  4:33 PM   Specimen: Urine, Random  Result Value Ref Range Status   Specimen Description   Final    URINE, RANDOM Performed at [facility], [address], [place], [place] [address]    Special Requests   Final    NONE Performed at [facility], [address] [place], [place] [address]    Culture (A)  Final    <10,000 COLONIES/mL INSIGNIFICANT GROWTH Performed at [facility], [place] [address], [place], [place] [address]    Report Status 09/21/2020 FINAL  Final         Radiology Studies: No results found.      Scheduled Meds: . Chlorhexidine Gluconate Cloth  6 each Topical Daily  . diltiazem  240 mg Oral Daily  . metoprolol succinate  100 mg Oral Daily  . mirtazapine  15 mg Oral QHS  . polyethylene glycol  17 g Oral Daily  . sodium chloride flush  3 mL Intravenous Q12H   Continuous Infusions: . sodium chloride       LOS: 5 days    Time spent: 25 min    Desma Maxim, MD [facility]   If 7PM-7AM, please contact night-coverage [email or web address] Password [email or web address] 09/24/2020, 10:55 AM

## 2020-09-24 NOTE — Op Note (Addendum)
Date of procedure: 09/24/20  Preoperative diagnosis:  1. Bilateral hydroureteronephrosis 2. Gross hematuria 3. Abnormal CT scan of bladder  Postoperative diagnosis:  1. Same as above 2. Bladder mass 3. Bilateral ureteral obstruction  Procedure: 1. Cystoscopy 2. Cystogram 3. Bilateral retrograde pyelogram 4. Right ureteral stent placement 5. Bladder biopsy 6. TURBT, medium 7. Failed attempted left ureteral stent  Surgeon: Hollice Espy, MD  Anesthesia: General  Complications: None  Intraoperative findings: Infiltrative nodular type mass involving trigone and left bladder neck, approximately 2 cm with other areas in the bladder which were also suspicious.  Irregular nondiscrete borders.  Bilateral hydroureteronephrosis down to the level of the bladder.  Resection of the left hemitrigone revealed the UO however unable to cannulate advance ureteroscope and/or wire, unable to place stent.  Retained left ureteral contrast.  EBL: Minimal  Specimens: Bladder biopsy, targeted and bladder trigone mass  Drains: 6 x 24 French double-J ureteral stent, Bard Optima on right, 16 French Foley catheter  Indication: Gloria Gill is a 75 y.o. patient with acute renal insufficiency with bilateral hydronephrosis as well as irregular CT scan concerning for possible underlying bladder mass.  She also had hematuria upon catheter placement which was concerning..  After reviewing the management options for treatment, she elected to proceed with the above surgical procedure(s). We have discussed the potential benefits and risks of the procedure, side effects of the proposed treatment, the likelihood of the patient achieving the goals of the procedure, and any potential problems that might occur during the procedure or recuperation. Informed consent has been obtained.  Description of procedure:  The patient was taken to the operating room and general anesthesia was induced.  The patient was  placed in the dorsal lithotomy position, prepped and draped in the usual sterile fashion, and preoperative antibiotics were administered. A preoperative time-out was performed.   A 21 French scope was advanced per urethra into the bladder.  The bladder was carefully inspected.  There was raised edema on the posterior bladder wall consistent with catheter cystitis as well as some inflammation and mucus.  At this point time, I filled the bladder with approximate 300 cc of dilute contrast.  This showed an irregular contour.  There is no vesicoureteral reflux.  There is no contrast extravasation.    On further closer look, in addition to the more bullous edema, there is areas of nodularity, almost submucosal in appearance with whitish small round almost fish egg appearance.  A few these areas were had neovascularity which was also apparent.  Please see pictures for details.  These areas are primarily on the posterior bladder wall as well as the trigone and left bladder neck.  There appear to be a confluence of more solid nodular tumor, approximately 2 cm overlying the trigone as well as the left bladder neck.  On bimanual exam, there were some abnormality/firmness of the bladder neck as well.  Attention was turned to the right ureteral orifice.  This could be seen and ultimately I was able to cannulated using a wire and a 5 [place] open-ended ureteral catheter.  I performed a right retrograde pyelogram which showed hydroureteronephrosis down to the level of the bladder the distal ureter was compressed in an obstructive type appearance.  Was able to eventually advance a wire up to the level of the kidney.  Ended up placing a 6 x 24 French Bard Optima ureteral stent upon removing the wire such that a curl was created both within the renal pelvis as  well as in the bladder.  Attention was then turned to the left ureteral orifice.  I was unable to identify this area due to the presence of infiltrative mass.  In the  area where the UO was a suspected, there was raised both bullous but as well as nodular tumor in the UO could not be identified.  Next, I went ahead and did some representative biopsies, approximately 5 or so including from the trigone, left bladder neck and posterior bladder wall.  These were passed on the field as targeted bladder biopsies performed with cold cup biopsy forceps.  This created a good about of bleeding.  The cystoscope was then exchanged for the resectoscope I used the bipolar loop to resect the nodular tumor at the bladder neck, approximately 2 cm with the tumor.  I resected over the expected area of the left UO and upon taking this down some, I did identify an opening which was felt to be the UO.  After the bladder tumor chips were irrigated from the bladder, I used the laser bridge to advance a 5 [place] open-ended ureteral catheter just at the opening of this suspected UO.  I was not able to cannulate or advance it any.  I did perform a high pressure retrograde which revealed a filling defect within the left distal ureter, possibly secondary to tumor with hydroureteronephrosis to this level.  The hydro on the side was more moderate in nature with tortuosity of the proximal ureter consistent with high-grade obstruction.  I then spent quite some time using various techniques including using angled Glidewire, regular wire, a semirigid ureteroscope, a Kumpe catheter all in attempts to cannulate this UO unsuccessfully.  Ultimately I was never able to advance a wire or place a stent on this side.  Careful and adequate hemostasis was achieved.  Elected to replace an 62 French Foley catheter overnight for maximal urinary decompression.  Hemostasis was reasonable.  As point time, the procedure was deemed complete.  The patient was cleaned and dried, repositioned in supine position, reversed from anesthesia, and taken to the PACU in stable condition.  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  Right ureteral stent placed along with Foley for urinary decompression however unable to place left-sided ureteral stent, will likely need percutaneous nephrostomy tube tomorrow to optimize renal function.  We will follow up the pathology.  Depending on this, she may need reresection as an outpatient versus alternative therapy.  We will continue to follow her.  We will advance diet for tonight make her n.p.o. again at midnight for potential left percutaneous nephrostomy tube.  Hollice Espy, M.D.

## 2020-09-24 NOTE — Care Management Important Message (Signed)
Important Message  Patient Details  Name: Gloria Gill MRN: [id number] Date of Birth: 1945/10/10   Medicare Important Message Given:  Yes     Dannette Barbara 09/24/2020, 12:25 PM

## 2020-09-25 ENCOUNTER — Other Ambulatory Visit: Payer: Self-pay

## 2020-09-25 ENCOUNTER — Other Ambulatory Visit: Payer: Medicare PPO

## 2020-09-25 DIAGNOSIS — D494 Neoplasm of unspecified behavior of bladder: Secondary | ICD-10-CM

## 2020-09-25 DIAGNOSIS — N3289 Other specified disorders of bladder: Secondary | ICD-10-CM

## 2020-09-25 DIAGNOSIS — N135 Crossing vessel and stricture of ureter without hydronephrosis: Secondary | ICD-10-CM

## 2020-09-25 DIAGNOSIS — R310 Gross hematuria: Secondary | ICD-10-CM

## 2020-09-25 DIAGNOSIS — F332 Major depressive disorder, recurrent severe without psychotic features: Secondary | ICD-10-CM | POA: Diagnosis not present

## 2020-09-25 LAB — BASIC METABOLIC PANEL
Anion gap: 9 (ref 5–15)
BUN: 28 mg/dL — ABNORMAL HIGH (ref 8–23)
CO2: 23 mmol/L (ref 22–32)
Calcium: 9 mg/dL (ref 8.9–10.3)
Chloride: 108 mmol/L (ref 98–111)
Creatinine, Ser: 2.68 mg/dL — ABNORMAL HIGH (ref 0.44–1.00)
GFR, Estimated: 18 mL/min — ABNORMAL LOW (ref >60)
Glucose, Bld: 217 mg/dL — ABNORMAL HIGH (ref 70–99)
Potassium: 4.5 mmol/L (ref 3.5–5.1)
Sodium: 140 mmol/L (ref 135–145)

## 2020-09-25 LAB — PROTIME-INR
INR: 1.2 (ref 0.8–1.2)
Prothrombin Time: 15 seconds (ref 11.4–15.2)

## 2020-09-25 LAB — CBC
HCT: 39.6 % (ref 36.0–46.0)
Hemoglobin: 12.5 g/dL (ref 12.0–15.0)
MCH: 30.8 pg (ref 26.0–34.0)
MCHC: 31.6 g/dL (ref 30.0–36.0)
MCV: 97.5 fL (ref 80.0–100.0)
Platelets: 320 10*3/uL (ref 150–400)
RBC: 4.06 MIL/uL (ref 3.87–5.11)
RDW: 12.8 % (ref 11.5–15.5)
WBC: 6.5 10*3/uL (ref 4.0–10.5)
nRBC: 0 % (ref 0.0–0.2)

## 2020-09-25 MED ORDER — SODIUM CHLORIDE 0.9 % IV SOLN: INTRAVENOUS | Status: AC

## 2020-09-25 NOTE — Plan of Care (Signed)
  Problem: Education: Goal: Knowledge of General Education information will improve Description: Including pain rating scale, medication(s)/side effects and non-pharmacologic comfort measures Outcome: Progressing   Problem: Health Behavior/Discharge Planning: Goal: Ability to manage health-related needs will improve Outcome: Progressing   Problem: Clinical Measurements: Goal: Ability to maintain clinical measurements within normal limits will improve Outcome: Progressing Goal: Will remain free from infection Outcome: Progressing Goal: Diagnostic test results will improve Outcome: Progressing Goal: Respiratory complications will improve Outcome: Progressing Goal: Cardiovascular complication will be avoided Outcome: Progressing   Problem: Activity: Goal: Risk for activity intolerance will decrease Outcome: Progressing   Problem: Nutrition: Goal: Adequate nutrition will be maintained Outcome: Progressing   Problem: Coping: Goal: Level of anxiety will decrease Outcome: Progressing   Problem: Elimination: Goal: Will not experience complications related to bowel motility Outcome: Progressing Goal: Will not experience complications related to urinary retention Outcome: Progressing   Problem: Pain Managment: Goal: General experience of comfort will improve Outcome: Progressing   Problem: Safety: Goal: Ability to remain free from injury will improve Outcome: Progressing   Problem: Skin Integrity: Goal: Risk for impaired skin integrity will decrease Outcome: Progressing     Pt came back from surgery around 21:30 last night. Pt is AAOx4. Pt's VSS. Pt's cath draining pink color urine output. All needs attended. Call light is within reach. Safety measures maintained. Will continue to monitor.

## 2020-09-25 NOTE — Progress Notes (Addendum)
Due to scheduling conflicts IR is unable to accommodate patient for left PCN procedure today. The patient is tentatively scheduled for 09/26/20 at 2 pm. An order will be placed for her to be NPO after midnight. Her medical team has been notified via [place].   Please call IR with any questions.  Soyla Dryer, [place] [phone] 09/25/2020, 3:10 PM

## 2020-09-25 NOTE — Anesthesia Postprocedure Evaluation (Signed)
Anesthesia Post Note  Patient: Kelsha Older Khaimov  Procedure(s) Performed: CYSTOSCOPY WITH RETROGRADE PYELOGRAM (Bilateral ) CYSTOSCOPY WITH RIGHT STENT PLACEMENT (Bilateral ) TRANSURETHRAL RESECTION OF BLADDER TUMOR (TURBT) (N/A ) CYSTOSCOPY WITH BIOPSY (N/A ) CYSTOGRAM  Patient location during evaluation: PACU Anesthesia Type: General Level of consciousness: awake and alert and oriented Pain management: pain level controlled Vital Signs Assessment: post-procedure vital signs reviewed and stable Respiratory status: spontaneous breathing Cardiovascular status: blood pressure returned to baseline Anesthetic complications: no   No complications documented.   Last Vitals:  Vitals:   09/25/20 0404 09/25/20 0738  BP: 110/64 118/70  Pulse: 80 82  Resp: 16 17  Temp: 37.1 C 36.9 C  SpO2: 97% 96%    Last Pain:  Vitals:   09/25/20 0738  TempSrc: Oral  PainSc:                  Khaleelah Yowell

## 2020-09-25 NOTE — Progress Notes (Signed)
Mobility Specialist - Progress Note   09/25/20 1250  Mobility  Activity Ambulated in hall  Level of Assistance Modified independent, requires aide device or extra time  Assistive Device None (Pushed IV pole)  Distance Ambulated (ft) 530 ft  Mobility Response Tolerated well  Mobility performed by Mobility specialist  $Mobility charge 1 Mobility    Pre-mobility: 89 HR, 91% SpO2 During mobility: 119 HR, 91% SpO2 Post-mobility: 83 HR, 97% SpO2   Pt was lying in bed upon arrival utilizing room air. Pt agreed to session. Pt denied pain, nausea, and fatigue this date. Pt is independent getting EOB, but does require extra time and use of bed rails. Pt denied dizziness upon sitting. Pt stood at bedside with close supervision. Pt ambulated 27' in hallway without use of AD. Pt pushed own IV pole. Pt denied SOB, weakness in LE, and dizziness throughout activity. O2 maintained 91% during ambulation at decreased speed. Max HR 119 bpm. Pt motivated for ambulation this date.overall, pt tolerated session well. Upon return to room, pt sat EOB as mobility provided clean bed chucks. Pt returned supine independently. Pt was left in bed with all needs in reach and alarm set.    Gloria Gill Mobility Specialist 09/25/20, 12:56 PM

## 2020-09-25 NOTE — Consult Note (Signed)
Chief Complaint: Patient was seen in consultation today for  Chief Complaint  Patient presents with  . Abdominal Pain  . SI    Referring Physician(s): Debroah Loop, PA-C  Supervising Physician: Mir, Sharen Heck  Patient Status: [place] - In-pt  History of Present Illness: Gloria Gill is a 75 y.o. female with a medical history significant for paroxysmal atrial fibrillation ([place]), alcohol abuse (last drink three years ago), cardiomyopathy, anxiety and HTN. She presented to the [facility] ED 09/19/20 with lower abdominal pain and hematuria for three weeks. She described the pain as sharp cramping in the lower abdomen, left greater than right. She had previously gone to her doctor and was given a presumed diagnosis of diverticulitis and was started on oral antibiotics.  Work up in the ED was significant for AKI secondary to bilateral hydronephrosis. While in the ED she developed atrial fibrillation with RVR and a cardizem infusion was started. She also expressed some suicidal ideation secondary to the ongoing pain as well as the stress of being the primary caregiver for her ill husband. She was evaluated by psychiatry and started on Remeron.   CT Abdomen/Pelvis 09/19/20   IMPRESSION: 1. Diffuse colonic diverticulosis.  No active diverticulitis. 2. Bilateral hydronephrosis of unknown etiology. Ureters are dilated to the bladder. No visible stones. Bladder is decompressed. 3. Small amount of free fluid in the pelvis. 4. Aortic atherosclerosis. 5. Small right pleural effusion.  She underwent a cystoscopy with retrograde pyelogram on 09/24/20 and subsequently had a right stent placed as well as a transurethral resection of a bladder tumor that was identified during the procedure. A left stent placement was attempted but the urologist was unable to gain access.     Interventional Radiology has been asked to evaluate this patient for an image-guided left percutaneous nephrostomy tube  for left hydronephrosis.   Past Medical History:  Diagnosis Date  . Atrial fibrillation ([place])   . Hypertension     Past Surgical History:  Procedure Laterality Date  . APPENDECTOMY    . BREAST SURGERY    . REDUCTION MAMMAPLASTY Bilateral 1981   pt stated she had implants with these scars/had implants removed 2009    Allergies: Lisinopril  Medications: Prior to Admission medications   Medication Sig Start Date End Date Taking? Authorizing Provider  ELIQUIS 5 MG TABS tablet Take 5 mg by mouth 2 (two) times daily. 08/31/20  Yes [provider]  metoprolol succinate (TOPROL-XL) 100 MG 24 hr tablet Take 100 mg by mouth daily. 09/08/20  Yes [provider]  oxybutynin (DITROPAN-XL) 10 MG 24 hr tablet Take 10 mg by mouth daily. 08/04/20  Yes [provider]  furosemide (LASIX) 20 MG tablet Take 20 mg by mouth daily. Patient not taking: No sig reported 07/26/20   [provider]  losartan (COZAAR) 25 MG tablet Take 25 mg by mouth daily. Patient not taking: No sig reported 07/26/20   [provider]  meloxicam (MOBIC) 15 MG tablet Take 15 mg by mouth daily. Patient not taking: No sig reported 05/22/20   [provider]  venlafaxine XR (EFFEXOR-XR) 37.5 MG 24 hr capsule Take 37.5 mg by mouth daily. Patient not taking: No sig reported 09/03/20   [provider]  Vitamin D, Ergocalciferol, (DRISDOL) 1.25 MG (50000 UNIT) CAPS capsule Take 50,000 Units by mouth once a week. 07/27/20   [provider]     Family History  Problem Relation Age of Onset  . Breast cancer Neg Hx  Social History   Socioeconomic History  . Marital status: Married    Spouse name: Not on file  . Number of children: Not on file  . Years of education: Not on file  . Highest education level: Not on file  Occupational History  . Not on file  Tobacco Use  . Smoking status: Never Smoker  . Smokeless tobacco: Never Used  Substance and Sexual  Activity  . Alcohol use: Not Currently  . Drug use: Never  . Sexual activity: Not on file  Other Topics Concern  . Not on file  Social History Narrative  . Not on file   Social Determinants of Health   Financial Resource Strain: Not on file  Food Insecurity: Not on file  Transportation Needs: Not on file  Physical Activity: Not on file  Stress: Not on file  Social Connections: Not on file    Review of Systems: A 12 point ROS discussed and pertinent positives are indicated in the HPI above.  All other systems are negative.  Review of Systems  Constitutional: Positive for appetite change and fatigue.  Respiratory: Negative for cough and shortness of breath.   Cardiovascular: Negative for chest pain and leg swelling.  Gastrointestinal: Positive for abdominal distention. Negative for diarrhea, nausea and vomiting.  Genitourinary: Positive for hematuria.  Neurological: Positive for headaches. Negative for dizziness.    Vital Signs: BP 118/70 (BP Location: Right Arm)   Pulse 82   Temp 98.5 F (36.9 C) (Oral)   Resp 17   Ht 5' 9.5" (1.765 m)   Wt 169 lb 8.5 oz (76.9 kg)   SpO2 96%   BMI 24.68 kg/m   Physical Exam Constitutional:      General: She is not in acute distress. HENT:     Mouth/Throat:     Mouth: Mucous membranes are moist.     Pharynx: Oropharynx is clear.  Cardiovascular:     Rate and Rhythm: Normal rate. Rhythm irregular.     Pulses: Normal pulses.     Heart sounds: Normal heart sounds.  Pulmonary:     Effort: Pulmonary effort is normal.     Breath sounds: Normal breath sounds.  Abdominal:     General: Bowel sounds are normal. There is distension.  Genitourinary:    Comments: Foley catheter in place; blood-tinged urine in bag.  Skin:    General: Skin is warm and dry.  Neurological:     Mental Status: She is alert and oriented to person, place, and time.     Imaging: CT ABDOMEN PELVIS WO CONTRAST  Result Date: 09/19/2020 CLINICAL DATA:  Left  lower quadrant pain. EXAM: CT ABDOMEN AND PELVIS WITHOUT CONTRAST TECHNIQUE: Multidetector CT imaging of the abdomen and pelvis was performed following the standard protocol without IV contrast. COMPARISON:  03/20/2005 FINDINGS: Lower chest: Small right pleural effusion.  No confluent opacities. Hepatobiliary: No focal hepatic abnormality. Gallbladder unremarkable. Pancreas: No focal abnormality or ductal dilatation. Spleen: No focal abnormality.  Normal size. Adrenals/Urinary Tract: Bilateral hydronephrosis. No renal or ureteral stones. The ureters are dilated to the urinary bladder which is decompressed. Adrenal glands unremarkable. Stomach/Bowel: Colonic diverticulosis diffusely. No active diverticulitis. Stomach and small bowel decompressed, unremarkable. Vascular/Lymphatic: Aortic atherosclerosis. No evidence of aneurysm or adenopathy. Reproductive: Uterus and adnexa unremarkable.  No mass. Other: Free fluid noted in the pelvis anterior to the uterus. No free air. Musculoskeletal: No acute bony abnormality. IMPRESSION: Diffuse colonic diverticulosis.  No active diverticulitis. Bilateral hydronephrosis of unknown etiology. Ureters are  dilated to the bladder. No visible stones. Bladder is decompressed. Small amount of free fluid in the pelvis. Aortic atherosclerosis. Small right pleural effusion. Electronically Signed   By: Rolm Baptise M.D.   On: 09/19/2020 10:44   DG OR UROLOGY CYSTO IMAGE ([facility] ONLY)  Result Date: 09/24/2020 There is no interpretation for this exam.  This order is for images obtained during a surgical procedure.  Please See "Surgeries" Tab for more information regarding the procedure.    Labs:  CBC: Recent Labs    09/22/20 0425 09/23/20 0459 09/24/20 0718 09/25/20 0411  WBC 9.7 8.9 9.0 6.5  HGB 13.2 12.0 12.7 12.5  HCT 41.1 37.5 38.8 39.6  PLT 322 341 346 320    COAGS: Recent Labs    09/23/20 0459 09/23/20 1456 09/23/20 2155 09/24/20 0713 09/25/20 0825  INR  --    --   --   --  1.2  APTT 38* 118* 111* 31  --     BMP: Recent Labs    09/22/20 0425 09/23/20 0459 09/24/20 0718 09/25/20 0411  NA 138 140 141 140  K 4.2 4.2 5.1 4.5  CL 105 107 107 108  CO2 21* 24 24 23   GLUCOSE 91 89 90 217*  BUN 27* 30* 30* 28*  CALCIUM 8.9 8.7* 9.1 9.0  CREATININE 2.76* 2.93* 3.16* 2.68*  GFRNONAA 17* 16* 15* 18*    LIVER FUNCTION TESTS: Recent Labs    09/19/20 0945  BILITOT 0.8  AST 25  ALT 21  ALKPHOS 49  PROT 7.2  ALBUMIN 3.8    TUMOR MARKERS: No results for input(s): AFPTM, CEA, CA199, CHROMGRNA in the last 8760 hours.  Assessment and Plan:  Suspected bladder cancer with bilateral hydronephrosis, S/p cystoscopy, TURBT and right stent placement; unsuccessful left stent attempt: Gloria Gill, 75 year old female, is tentatively scheduled today for an image-guided left percutaneous nephrostomy. She has been NPO and is not currently on any blood-thinning medications.   Risks and benefits of Left PCN placement were discussed with the patient including, but not limited to, infection, bleeding, significant bleeding causing loss or decrease in renal function or damage to adjacent structures.   All of the patient's questions were answered, patient is agreeable to proceed.  Consent signed and in chart.  Thank you for this interesting consult.  I greatly enjoyed meeting Gloria Gill and look forward to participating in their care.  A copy of this report was sent to the requesting provider on this date.  Electronically Signed: Soyla Dryer, AGACNP-BC [phone] 09/25/2020, 9:01 AM   I spent a total of 20 Minutes    in face to face in clinical consultation, greater than 50% of which was counseling/coordinating care for left PCN placement

## 2020-09-25 NOTE — Progress Notes (Signed)
Urology Inpatient Progress Note  Subjective: No acute events overnight.  She has been n.p.o. since midnight.  She remains off anticoagulation. Creatinine down today, 2.68.  Hemoglobin stable, 12.5. Foley catheter in place draining red urine with blood product sediment without clots.  She denies pain or flank discomfort today.  Anti-infectives: Anti-infectives (From admission, onward)   Start     Dose/Rate Route Frequency Ordered Stop   09/24/20 1730  ceFAZolin (ANCEF) IVPB 2g/100 mL premix        2 g 200 mL/hr over 30 Minutes Intravenous  Once 09/24/20 1722 09/24/20 1804   09/24/20 1722  ceFAZolin (ANCEF) 2-4 GM/100ML-% IVPB       Note to Pharmacy: Register, Santiago Glad   : cabinet override      09/24/20 1722 09/24/20 1804      Current Facility-Administered Medications  Medication Dose Route Frequency Provider Last Rate Last Admin  . 0.9 %  sodium chloride infusion  250 mL Intravenous PRN Hollice Espy, MD      . Chlorhexidine Gluconate Cloth 2 % PADS 6 each  6 each Topical Daily Hollice Espy, MD   6 each at 09/24/20 [id number]  . diltiazem (CARDIZEM CD) 24 hr capsule 240 mg  240 mg Oral Daily Hollice Espy, MD   240 mg at 09/24/20 0820  . lip balm (BLISTEX) ointment   Topical PRN Hollice Espy, MD      . metoprolol succinate (TOPROL-XL) 24 hr tablet 100 mg  100 mg Oral Daily Hollice Espy, MD   100 mg at 09/24/20 0820  . mirtazapine (REMERON) tablet 15 mg  15 mg Oral QHS Hollice Espy, MD   15 mg at 09/24/20 2155  . morphine 4 MG/ML injection 4 mg  4 mg Intravenous Q4H PRN Hollice Espy, MD   4 mg at 09/20/20 0448  . ondansetron (ZOFRAN) injection 4 mg  4 mg Intravenous Q4H PRN Hollice Espy, MD   4 mg at 09/20/20 0108  . polyethylene glycol (MIRALAX / GLYCOLAX) packet 17 g  17 g Oral Daily Hollice Espy, MD   17 g at 09/23/20 0807  . senna-docusate (Senokot-S) tablet 2 tablet  2 tablet Oral QHS Hollice Espy, MD   2 tablet at 09/24/20 2155  . sodium chloride flush (NS) 0.9 %  injection 3 mL  3 mL Intravenous Q12H Hollice Espy, MD   3 mL at 09/24/20 2155  . sodium chloride flush (NS) 0.9 % injection 3 mL  3 mL Intravenous PRN Hollice Espy, MD   3 mL at 09/21/20 2107   Objective: Vital signs in last 24 hours: Temp:  [97.5 F (36.4 C)-99.6 F (37.6 C)] 98.5 F (36.9 C) (03/08 0738) Pulse Rate:  [64-300] 82 (03/08 0738) Resp:  [8-25] 17 (03/08 0738) BP: (110-156)/(64-107) 118/70 (03/08 0738) SpO2:  [92 %-100 %] 96 % (03/08 0738) Weight:  [76.7 kg-76.9 kg] 76.9 kg (03/08 0404)  Intake/Output from previous day: 03/07 0701 - 03/08 0700 In: 1420.4 [P.O.:400; I.V.:1020.4] Out: 3010 [Urine:3010] Intake/Output this shift: No intake/output data recorded.  Physical Exam Vitals and nursing note reviewed.  Constitutional:      General: She is not in acute distress.    Appearance: She is not ill-appearing, toxic-appearing or diaphoretic.  HENT:     Head: Normocephalic and atraumatic.  Pulmonary:     Effort: Pulmonary effort is normal. No respiratory distress.  Skin:    General: Skin is warm and dry.  Neurological:     Mental Status: She is alert and oriented  to person, place, and time.  Psychiatric:        Mood and Affect: Mood is depressed.        Behavior: Behavior normal.    Lab Results:  Recent Labs    09/24/20 0718 09/25/20 0411  WBC 9.0 6.5  HGB 12.7 12.5  HCT 38.8 39.6  PLT 346 320   BMET Recent Labs    09/24/20 0718 09/25/20 0411  NA 141 140  K 5.1 4.5  CL 107 108  CO2 24 23  GLUCOSE 90 217*  BUN 30* 28*  CREATININE 3.16* 2.68*  CALCIUM 9.1 9.0   PT/INR Recent Labs    09/25/20 0825  LABPROT 15.0  INR 1.2   Assessment & Plan: 75 year old female with AKI secondary to bilateral hydronephrosis in the setting of a decompressed urinary bladder now s/p cystoscopy and TURBT with Dr. 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  Dr. Erlene Quan was able to  place a right ureteral stent but unable to access the left UO.  I had a lengthy conversation with the patient and her husband via telephone this morning.  I explained that Dr. Cherrie Gauze intraoperative findings are consistent with possible bladder cancer and that we are awaiting her surgical pathology results.  I explained that in light of the inability to place a left ureteral state intraoperatively yesterday, she will require a left nephrostomy tube for drainage of her left hydronephrosis.  Patient expressed understanding and is in agreement with this plan.  I spoke with IR via telephone today, ordered PT/INR per their request, and placed an order for left nephrostomy tube.  It appears she will be placed on the schedule for this afternoon.  Please keep patient n.p.o. and off anticoagulation in advance of procedure later today.  Debroah Loop, PA-C 09/25/2020

## 2020-09-25 NOTE — Progress Notes (Addendum)
PROGRESS NOTE    Gloria Gill  CWC:[id number] DOB: 10/30/1945 DOA: 09/19/2020 PCP: [name], MD  Outpatient Specialists: [facility]    Brief Narrative:   Gloria Gill is a 76 y.o. female with medical history significant for etoh (sober 3 years), a fib on eliquis, etoh cardiomyopathy, who presents with the above.  Few weeks of crampy suprapubic pain. Seen by pcp a week or so ago, clinical dx diverticulitis, started on augmentin. Didn't improved. Labs at f/u showed aki so referred to ED. Patient reports she was started on oxybutynin 3 weeks ago for urinary incontinence. No diarrhea or vomiting. No chest pain. No dysuria or fevers. Is making urine but thinks urinating less. No history kidney stones. Denies hematuria.  Told ED provider she's thinking of harming herself. Endorses SI and says plan is to drive into something with her car. 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 Denies alcohol or drug use.   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 Cr 3.08 from normal previously. Cr hovering around 3, improved to 2.68 today. Cystoscopy 3/7 showing likely malignancy. TURBT performed, path pending. R ureteral stent placed, unable to stent on the left. K and bicarb wnl. Hematuria overnight. h/h stable. Pain resolved. Urine culture neg.  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 Per cardiology record most recent EF 40%. Appears compensated - hold lasix and losartan as above - cont home metoprolol  # Paroxysmal a fib # RVR rvr resolved with cardizem gtt, now off - hold eliquis as above  - cont home metop - cont cardizem oral which is new - cardiology has signed off, will need f/u with outpatient  # Constipation No BM w miralax - cont miralax - added senna/docusate qhs   DVT prophylaxis: SCDs Code Status: full Family Communication:  None @ bedside  Level of care: Med-Surg Status is: Inpatient  Remains inpatient appropriate because:Inpatient level of care appropriate due to severity of illness   Dispo: The patient is from: Home              Anticipated d/c is to: Home              Patient currently is not medically stable to d/c.   Difficult to place patient No        Consultants:  Urology, psychiatry (signed off), cardiology (signed off)  Procedures: none  Antimicrobials:  none    Subjective: Has appetite, abd pain resolved. No overnight events. Npo. Tolerated yesterday's procedure  Objective: Vitals:   09/24/20 2059 09/24/20 2154 09/25/20 0404 09/25/20 0738  BP: (!) 142/93 136/84 110/64 118/70  Pulse: 90 64 80 82  Resp: (!) 23 (!)  22 16 17   Temp: (!) 97.5 F (36.4 C) 97.8 F (36.6 C) 98.7 F (37.1 C) 98.5 F (36.9 C)  TempSrc:  Oral Oral Oral  SpO2: 97% 99% 97% 96%  Weight:   76.9 kg   Height:        Intake/Output Summary (Last 24 hours) at 09/25/2020 1023 Last data filed at 09/25/2020 0600 Gross per 24 hour  Intake 1420.4 ml  Output 2675 ml  Net -1254.6 ml   Filed Weights   09/24/20 0100 09/24/20 1436 09/25/20 0404  Weight: 76.7 kg 76.7 kg 76.9 kg     Examination:  Constitutional: No acute distress Head: Atraumatic Eyes: Conjunctiva clear ENM: Moist mucous membranes.  Neck: Supple Respiratory: Clear to auscultation bilaterally, no wheezing/rales/rhonchi. Normal respiratory effort. No accessory muscle use. . Cardiovascular: Regular rate and rhythm. No murmurs/rubs/gallops. Abdomen: soft, non-tender. No masses. No rebound or guarding. Positive bowel sounds. Musculoskeletal: No joint deformity upper and lower extremities. Normal ROM, no contractures. Normal muscle tone.  Skin: No rashes, lesions, or ulcers.  Extremities: No peripheral edema. Palpable peripheral pulses. Neurologic: Alert, moving all 4 extremities. Psychiatric: Normal insight and judgement.    Data Reviewed: I have personally reviewed following labs and imaging studies  CBC: Recent Labs  Lab 09/21/20 0558 09/22/20 0425 09/23/20 0459 09/24/20 0718 09/25/20 0411  WBC 11.3* 9.7 8.9 9.0 6.5  HGB 12.5 13.2 12.0 12.7 12.5  HCT 38.9 41.1 37.5 38.8 39.6  MCV 96.5 96.7 96.2 94.4 97.5  PLT 363 322 341 346 951   Basic Metabolic Panel: Recent Labs  Lab 09/21/20 0558 09/22/20 0425 09/23/20 0459 09/24/20 0718 09/25/20 0411  NA 141 138 140 141 140  K 4.0 4.2 4.2 5.1 4.5  CL 107 105 107 107 108  CO2 24 21* 24 24 23   GLUCOSE 104* 91 89 90 217*  BUN 25* 27* 30* 30* 28*  CREATININE 2.69* 2.76* 2.93* 3.16* 2.68*  CALCIUM 8.8* 8.9 8.7* 9.1 9.0   GFR: Estimated Creatinine Clearance: 19 mL/min (A) (by C-G formula based on SCr of 2.68 mg/dL (H)). Liver Function Tests: Recent Labs  Lab 09/19/20 0945  AST 25  ALT 21  ALKPHOS 49  BILITOT 0.8  PROT 7.2  ALBUMIN 3.8   Recent Labs  Lab 09/19/20 0945  LIPASE 35   No results for input(s): AMMONIA in the last 168 hours. Coagulation Profile: Recent Labs  Lab 09/25/20 0825  INR 1.2   Cardiac Enzymes: No results for input(s): CKTOTAL, CKMB, CKMBINDEX, TROPONINI in the last 168 hours. BNP (last 3  results) No results for input(s): PROBNP in the last 8760 hours. HbA1C: No results for input(s): HGBA1C in the last 72 hours. CBG: No results for input(s): GLUCAP in the last 168 hours. Lipid Profile: No results for input(s): CHOL, HDL, LDLCALC, TRIG, CHOLHDL, LDLDIRECT in the last 72 hours. Thyroid Function Tests: No results for input(s): TSH, T4TOTAL, FREET4, T3FREE, THYROIDAB in the last 72 hours. Anemia Panel: No results for input(s): VITAMINB12, FOLATE, FERRITIN, TIBC, IRON, RETICCTPCT in the last 72 hours. Urine analysis:    Component Value Date/Time   COLORURINE YELLOW (A) 09/19/2020 1501   APPEARANCEUR HAZY (A) 09/19/2020 1501   LABSPEC 1.010 09/19/2020 1501   PHURINE 5.0 09/19/2020 1501   GLUCOSEU NEGATIVE 09/19/2020 1501   HGBUR MODERATE (A) 09/19/2020 1501   BILIRUBINUR NEGATIVE 09/19/2020 1501   KETONESUR NEGATIVE 09/19/2020 1501   PROTEINUR 30 (A) 09/19/2020 1501   NITRITE NEGATIVE 09/19/2020 1501   LEUKOCYTESUR MODERATE (A) 09/19/2020  1501   Sepsis Labs: @LABRCNTIP (procalcitonin:4,lacticidven:4)  ) Recent Results (from the past 240 hour(s))  SARS CORONAVIRUS 2 (TAT 6-24 HRS) Nasopharyngeal Nasopharyngeal Swab     Status: None   Collection Time: 09/19/20 12:08 PM   Specimen: Nasopharyngeal Swab  Result Value Ref Range Status   SARS Coronavirus 2 NEGATIVE NEGATIVE Final    Comment: (NOTE) SARS-CoV-2 target nucleic acids are NOT DETECTED.  The SARS-CoV-2 RNA is generally detectable in upper and lower respiratory specimens during the acute phase of infection. Negative results do not preclude SARS-CoV-2 infection, do not rule out co-infections with other pathogens, and should not be used as the sole basis for treatment or other patient management decisions. Negative results must be combined with clinical observations, patient history, and epidemiological information. The expected result is Negative.  Fact Sheet for  Patients: SugarRoll.be  Fact Sheet for Healthcare Providers: https://www.woods-mathews.com/  This test is not yet approved or cleared by the [place] FDA and  has been authorized for detection and/or diagnosis of SARS-CoV-2 by FDA under an Emergency Use Authorization (EUA). This EUA will remain  in effect (meaning this test can be used) for the duration of the COVID-19 declaration under Se ction 564(b)(1) of the Act, 21 U.S.C. section 360bbb-3(b)(1), unless the authorization is terminated or revoked sooner.  Performed at [facility], [employer] [address], [place], [place] [address]   Urine Culture     Status: Abnormal   Collection Time: 09/19/20  4:33 PM   Specimen: Urine, Random  Result Value Ref Range Status   Specimen Description   Final    URINE, RANDOM Performed at [facility], [address], [place], [place] [address]    Special Requests   Final    NONE Performed at [facility], [address] [place], [place] [address]    Culture (A)  Final    <10,000 COLONIES/mL INSIGNIFICANT GROWTH Performed at [facility], [place] [address], [place], [place] [address]    Report Status 09/21/2020 FINAL  Final         Radiology Studies: DG OR UROLOGY CYSTO IMAGE [facility] ONLY)  Result Date: 09/24/2020 There is no interpretation for this exam.  This order is for images obtained during a surgical procedure.  Please See "Surgeries" Tab for more information regarding the procedure.        Scheduled Meds: . Chlorhexidine Gluconate Cloth  6 each Topical Daily  . diltiazem  240 mg Oral Daily  . metoprolol succinate  100 mg Oral Daily  . mirtazapine  15 mg Oral QHS  . polyethylene glycol  17 g Oral Daily  . senna-docusate  2 tablet Oral QHS  . sodium chloride flush  3 mL Intravenous Q12H   Continuous Infusions: . sodium chloride    . sodium chloride 125 mL/hr at 09/25/20 0929     LOS: 6 days     Time spent: 25 min    Desma Maxim, MD [facility]   If 7PM-7AM, please contact night-coverage [email or web address] Password [facility] 09/25/2020, 10:23 AM

## 2020-09-26 ENCOUNTER — Inpatient Hospital Stay: Payer: Medicare PPO

## 2020-09-26 ENCOUNTER — Other Ambulatory Visit: Payer: Self-pay

## 2020-09-26 ENCOUNTER — Encounter: Payer: Self-pay | Admitting: Urology

## 2020-09-26 DIAGNOSIS — F332 Major depressive disorder, recurrent severe without psychotic features: Secondary | ICD-10-CM | POA: Diagnosis not present

## 2020-09-26 HISTORY — PX: IR NEPHROSTOMY PLACEMENT LEFT: IMG6063

## 2020-09-26 LAB — CBC
HCT: 36.2 % (ref 36.0–46.0)
Hemoglobin: 11.3 g/dL — ABNORMAL LOW (ref 12.0–15.0)
MCH: 30.8 pg (ref 26.0–34.0)
MCHC: 31.2 g/dL (ref 30.0–36.0)
MCV: 98.6 fL (ref 80.0–100.0)
Platelets: 324 10*3/uL (ref 150–400)
RBC: 3.67 MIL/uL — ABNORMAL LOW (ref 3.87–5.11)
RDW: 12.8 % (ref 11.5–15.5)
WBC: 19.8 10*3/uL — ABNORMAL HIGH (ref 4.0–10.5)
nRBC: 0 % (ref 0.0–0.2)

## 2020-09-26 LAB — BASIC METABOLIC PANEL
Anion gap: 6 (ref 5–15)
BUN: 27 mg/dL — ABNORMAL HIGH (ref 8–23)
CO2: 23 mmol/L (ref 22–32)
Calcium: 8.4 mg/dL — ABNORMAL LOW (ref 8.9–10.3)
Chloride: 112 mmol/L — ABNORMAL HIGH (ref 98–111)
Creatinine, Ser: 1.79 mg/dL — ABNORMAL HIGH (ref 0.44–1.00)
GFR, Estimated: 29 mL/min — ABNORMAL LOW (ref >60)
Glucose, Bld: 128 mg/dL — ABNORMAL HIGH (ref 70–99)
Potassium: 4.5 mmol/L (ref 3.5–5.1)
Sodium: 141 mmol/L (ref 135–145)

## 2020-09-26 LAB — PROTIME-INR
INR: 1.2 (ref 0.8–1.2)
Prothrombin Time: 14.3 seconds (ref 11.4–15.2)

## 2020-09-26 MED ORDER — MIDAZOLAM HCL 5 MG/5ML IJ SOLN
INTRAMUSCULAR | Status: AC
  Filled 2020-09-26: qty 5

## 2020-09-26 MED ORDER — FENTANYL CITRATE (PF) 100 MCG/2ML IJ SOLN
INTRAMUSCULAR | Status: AC
  Filled 2020-09-26: qty 2

## 2020-09-26 MED ORDER — IODIXANOL 320 MG/ML IV SOLN
50.0000 mL | Freq: Once | INTRAVENOUS | Status: AC | PRN
  Administered 2020-09-26: 10 mL

## 2020-09-26 MED ORDER — SODIUM CHLORIDE 0.9% FLUSH
5.0000 mL | Freq: Three times a day (TID) | INTRAVENOUS | Status: DC
  Administered 2020-09-26 – 2020-10-02 (×18): 5 mL

## 2020-09-26 MED ORDER — MIDAZOLAM HCL 2 MG/2ML IJ SOLN
INTRAMUSCULAR | Status: DC | PRN
  Administered 2020-09-26: 1 mg via INTRAVENOUS

## 2020-09-26 MED ORDER — FENTANYL CITRATE (PF) 100 MCG/2ML IJ SOLN
INTRAMUSCULAR | Status: DC | PRN
  Administered 2020-09-26: 50 ug via INTRAVENOUS

## 2020-09-26 NOTE — Progress Notes (Signed)
[facility] Progress Note  Patient: Gloria Gill    UVO:[id number]  DOA: 09/19/2020     Date of Service: the patient was seen and examined on 09/26/2020  Chief Complaint  Patient presents with  . Abdominal Pain  . SI   Brief hospital course:  Gloria Alsteen [name] a 75 y.o.femalewith medical history significant foretoh (sober 3 years), a fib on eliquis, etoh cardiomyopathy, who presents with the above.  Few weeks of crampy suprapubic pain. Seen by pcp a week or so ago, clinical dx diverticulitis, started on augmentin. Didn't improved. Labs at f/u showed aki so referred to ED. Patient reports she was started on oxybutynin 3 weeks ago for urinary incontinence. No diarrhea or vomiting. No chest pain. No dysuria or fevers. Is making urine but thinks urinating less. No history kidney stones. Denies hematuria.  Told ED provider she's thinking of harming herself. Endorses SI and says plan is to drive into something with her car. 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 Denies alcohol or drug use.   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 Cr 3.08 from normal previously.  Cr hovering around 3, improved to 2.68--1.79  Cystoscopy 3/7 showing likely malignancy. TURBT performed, path pending. R ureteral stent placed, unable to stent on the left. K and bicarb wnl. Hematuria overnight. h/h stable. Pain resolved. Urine culture neg.  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 Per cardiology record most recent EF 40%. Appears compensated - hold lasix and losartan as above - cont home metoprolol  # Paroxysmal a fib # RVR rvr resolved with cardizem gtt, now off - hold eliquis as above  - cont home metop - cont cardizem oral which is new - cardiology has signed off, will need f/u with outpatient  # Constipation - cont miralax - added senna/docusate qhs  Body mass index is 24.68 kg/m.         Diet: NPO Will resume diet after procedure DVT Prophylaxis: SCD, pharmacological prophylaxis contraindicated due to Risk of bleeding, anticipated procedure  Eliquis is on hold  Advance goals of care discussion: Full code  Family Communication: family was not present at bedside, at the time of interview.  The pt provided permission to discuss medical plan with the family. Opportunity was given to ask question and all questions were answered satisfactorily.   Disposition:  Pt is from home, admitted with bilateral hydronephrosis, found to have bladder cancer, right ureteral stent was placed, pending left percutaneous nephrostomy tube, plan for 3/9,  which precludes a safe discharge. Discharge to home versus SNF depends on recovery, when cleared by urology.  Subjective: No overnight issues, patient stated pain is under control, denies any chest pain or  shortness of breath.  Physical Exam: General:  Alert, NAD.  Appear in no distress, affect appropriate Eyes: PERRLA ENT: Oral Mucosa Clear, moist  Neck: no JVD,  Cardiovascular: S1 and S2 Present, no Murmur,  Respiratory: good respiratory effort, Bilateral Air entry equal and Decreased, no Crackles,  no wheezes Abdomen: Bowel Sound present, Soft and no tenderness,  Skin: no rashes Extremities: no Pedal edema, no calf tenderness Neurologic: without any new focal findings Gait not checked due to patient safety concerns  Vitals:   09/25/20 1558 09/25/20 1929 09/26/20 0448 09/26/20 0815  BP: (!) 113/58 116/71 128/71 (!) 141/82  Pulse: (!) 57 87 86 82  Resp: 17 16 18 16   Temp: 98.4 F (36.9 C) 97.6 F (36.4 C) 98.4 F (36.9 C) 97.6 F (36.4 C)  TempSrc: Oral Oral Oral Oral  SpO2: 96% 97% 94% 97%  Weight:      Height:        Intake/Output Summary (Last 24 hours) at 09/26/2020 1047 Last data filed at 09/26/2020 1035 Gross per 24 hour  Intake 906.99 ml  Output 3125 ml  Net -2218.01 ml   Filed Weights   09/24/20 0100 09/24/20 1436 09/25/20 0404  Weight: 76.7 kg 76.7 kg 76.9 kg    Data Reviewed: I have personally reviewed and interpreted daily labs, tele strips, imagings as discussed above. I reviewed all nursing notes, pharmacy notes, vitals, pertinent old records I have discussed plan of care as described above with RN and patient/family.  CBC: Recent Labs  Lab 09/22/20 0425 09/23/20 0459 09/24/20 0718 09/25/20 0411 09/26/20 0457  WBC 9.7 8.9 9.0 6.5 19.8*  HGB 13.2 12.0 12.7 12.5 11.3*  HCT 41.1 37.5 38.8 39.6 36.2  MCV 96.7 96.2 94.4 97.5 98.6  PLT 322 341 346 320 098   Basic Metabolic Panel: Recent Labs  Lab 09/22/20 0425 09/23/20 0459 09/24/20 0718 09/25/20 0411 09/26/20 0457  NA 138 140 141 140 141  K 4.2 4.2 5.1 4.5 4.5  CL 105 107 107 108 112*  CO2 21* 24 24 23 23   GLUCOSE 91 89 90 217* 128*  BUN 27* 30* 30* 28* 27*  CREATININE 2.76* 2.93* 3.16* 2.68* 1.79*  CALCIUM 8.9 8.7* 9.1 9.0 8.4*    Studies: No results found.  Scheduled Meds: . Chlorhexidine Gluconate Cloth  6 each Topical Daily  . diltiazem  240 mg Oral Daily  . metoprolol succinate  100 mg Oral Daily  . mirtazapine  15 mg Oral QHS  . polyethylene glycol  17 g Oral Daily  .  senna-docusate  2 tablet Oral QHS  . sodium chloride flush  3 mL Intravenous Q12H   Continuous Infusions: . sodium chloride     PRN Meds: sodium chloride, lip balm, morphine injection, ondansetron (ZOFRAN) IV, sodium chloride flush  Time spent: 35 minutes  Author: Val Riles. MD [facility] 09/26/2020 10:47 AM  To reach On-call, see care teams to locate the attending and reach out to them via [email or web address]. If 7PM-7AM, please contact night-coverage If you still have difficulty reaching the attending provider, please page the [facility] (Director on Call) for [facility] on amion for assistance.

## 2020-09-26 NOTE — Consult Note (Signed)
Brief Pharmacy Note  Anticoagulation / antiplatelet / inpatient medication list review  Indication: Post-IR L sided PCN tube placement  Bleeding risk: High  Medications:  Holding home apixaban for Afib  A/P: Continue to hold apixaban until at least POD # 2 (3/11 AM)  Benita Gutter 09/26/2020,3:52 PM

## 2020-09-26 NOTE — Procedures (Signed)
Pre Procedure Dx: Hydronephrosis due to bladder mass Post Procedural Dx: Same  Successful [place] and fluoroscopic guided placement of a left sided PCN with end coiled and locked within the renal pelvis. PCN connected to gravity bag.  EBL: Trace Complications: None immediate.  Ronny Bacon, MD Pager #: [phone]

## 2020-09-26 NOTE — Progress Notes (Signed)
Report called to Yvone Neu on [place]. Patient to transfer to room 201

## 2020-09-26 NOTE — Plan of Care (Signed)
Pt is AAOx4. Pt denies any pain. VSS. Foley is in placed and intact. Foley care was done. Urine is still red in color. No visible blood clots noted. Pt is going for PCN placement today. Pt has been NPO. All needs attended. Call light is within reach. Safety measures maintained. Will continue to monitor.     Problem: Education: Goal: Knowledge of General Education information will improve Description: Including pain rating scale, medication(s)/side effects and non-pharmacologic comfort measures Outcome: Progressing   Problem: Health Behavior/Discharge Planning: Goal: Ability to manage health-related needs will improve Outcome: Progressing   Problem: Clinical Measurements: Goal: Ability to maintain clinical measurements within normal limits will improve Outcome: Progressing Goal: Will remain free from infection Outcome: Progressing Goal: Diagnostic test results will improve Outcome: Progressing Goal: Respiratory complications will improve Outcome: Progressing Goal: Cardiovascular complication will be avoided Outcome: Progressing   Problem: Activity: Goal: Risk for activity intolerance will decrease Outcome: Progressing   Problem: Nutrition: Goal: Adequate nutrition will be maintained Outcome: Progressing   Problem: Coping: Goal: Level of anxiety will decrease Outcome: Progressing   Problem: Elimination: Goal: Will not experience complications related to bowel motility Outcome: Progressing Goal: Will not experience complications related to urinary retention Outcome: Progressing   Problem: Pain Managment: Goal: General experience of comfort will improve Outcome: Progressing   Problem: Safety: Goal: Ability to remain free from injury will improve Outcome: Progressing   Problem: Skin Integrity: Goal: Risk for impaired skin integrity will decrease Outcome: Progressing

## 2020-09-26 NOTE — Consult Note (Signed)
Chief Complaint: Bladder mass  Referring Physician(s): Erlene Quan (Urology)  Patient Status: [facility] - In-pt  History of Present Illness: Gloria Gill is a 75 y.o. female with past medical history significant for hypertension and atrial fibrillation who presented to the [facility] emergency department with left lower quadrant abdominal pain found to have bilateral pelvocaliectasis and ureterectasis.  Patient subsequent underwent retrograde placement of a right-sided ureteral stent however unsuccessful placement of a left-sided ureteral stent secondary to the presence of a bladder mass.  As such, patient now presents for image guided placement of left-sided nephrostomy catheter for urinary diversion purposes.  Continues to report hematuria though denies flank pain.  She denies chest pain, shortness of breath, fever or chills.  Past Medical History:  Diagnosis Date  . Atrial fibrillation ([place])   . Hypertension     Past Surgical History:  Procedure Laterality Date  . APPENDECTOMY    . BREAST SURGERY    . CYSTOGRAM  09/24/2020   Procedure: CYSTOGRAM;  Surgeon: Hollice Espy, MD;  Location: [facility] ORS;  Service: Urology;;  . Consuela Mimes W/ RETROGRADES Bilateral 09/24/2020   Procedure: CYSTOSCOPY WITH RETROGRADE PYELOGRAM;  Surgeon: Hollice Espy, MD;  Location: [facility] ORS;  Service: Urology;  Laterality: Bilateral;  . CYSTOSCOPY WITH BIOPSY N/A 09/24/2020   Procedure: CYSTOSCOPY WITH BIOPSY;  Surgeon: Hollice Espy, MD;  Location: [facility] ORS;  Service: Urology;  Laterality: N/A;  . CYSTOSCOPY WITH STENT PLACEMENT Bilateral 09/24/2020   Procedure: CYSTOSCOPY WITH RIGHT STENT PLACEMENT;  Surgeon: Hollice Espy, MD;  Location: [facility] ORS;  Service: Urology;  Laterality: Bilateral;  . REDUCTION MAMMAPLASTY Bilateral 1981   pt stated she had implants with these scars/had implants removed 2009  . TRANSURETHRAL RESECTION OF BLADDER TUMOR N/A 09/24/2020   Procedure: TRANSURETHRAL RESECTION OF  BLADDER TUMOR (TURBT);  Surgeon: Hollice Espy, MD;  Location: [facility] ORS;  Service: Urology;  Laterality: N/A;    Allergies: Lisinopril  Medications: Prior to Admission medications   Medication Sig Start Date End Date Taking? Authorizing Provider  ELIQUIS 5 MG TABS tablet Take 5 mg by mouth 2 (two) times daily. 08/31/20  Yes [provider]  metoprolol succinate (TOPROL-XL) 100 MG 24 hr tablet Take 100 mg by mouth daily. 09/08/20  Yes [provider]  oxybutynin (DITROPAN-XL) 10 MG 24 hr tablet Take 10 mg by mouth daily. 08/04/20  Yes [provider]  furosemide (LASIX) 20 MG tablet Take 20 mg by mouth daily. Patient not taking: No sig reported 07/26/20   [provider]  losartan (COZAAR) 25 MG tablet Take 25 mg by mouth daily. Patient not taking: No sig reported 07/26/20   [provider]  meloxicam (MOBIC) 15 MG tablet Take 15 mg by mouth daily. Patient not taking: No sig reported 05/22/20   [provider]  venlafaxine XR (EFFEXOR-XR) 37.5 MG 24 hr capsule Take 37.5 mg by mouth daily. Patient not taking: No sig reported 09/03/20   [provider]  Vitamin D, Ergocalciferol, (DRISDOL) 1.25 MG (50000 UNIT) CAPS capsule Take 50,000 Units by mouth once a week. 07/27/20   [provider]     Family History  Problem Relation Age of Onset  . Breast cancer Neg Hx     Social History   Socioeconomic History  . Marital status: Married    Spouse name: Not on file  . Number of children: Not on file  . Years of education: Not on file  . Highest education level: Not on file  Occupational History  .  Not on file  Tobacco Use  . Smoking status: Never Smoker  . Smokeless tobacco: Never Used  Substance and Sexual Activity  . Alcohol use: Not Currently  . Drug use: Never  . Sexual activity: Not on file  Other Topics Concern  . Not on file  Social History Narrative  . Not on file   Social Determinants of Health    Financial Resource Strain: Not on file  Food Insecurity: Not on file  Transportation Needs: Not on file  Physical Activity: Not on file  Stress: Not on file  Social Connections: Not on file    ECOG Status: 1 - Symptomatic but completely ambulatory  Review of Systems: A 12 point ROS discussed and pertinent positives are indicated in the HPI above.  All other systems are negative.  Review of Systems  Vital Signs: BP 138/84 (BP Location: Left Arm)   Pulse 89   Temp 97.7 F (36.5 C) (Oral)   Resp 18   Ht 5' 9.5" (1.765 m)   Wt 76.9 kg   SpO2 100%   BMI 24.68 kg/m   Physical Exam  Imaging: CT ABDOMEN PELVIS WO CONTRAST  Result Date: 09/19/2020 CLINICAL DATA:  Left lower quadrant pain. EXAM: CT ABDOMEN AND PELVIS WITHOUT CONTRAST TECHNIQUE: Multidetector CT imaging of the abdomen and pelvis was performed following the standard protocol without IV contrast. COMPARISON:  03/20/2005 FINDINGS: Lower chest: Small right pleural effusion.  No confluent opacities. Hepatobiliary: No focal hepatic abnormality. Gallbladder unremarkable. Pancreas: No focal abnormality or ductal dilatation. Spleen: No focal abnormality.  Normal size. Adrenals/Urinary Tract: Bilateral hydronephrosis. No renal or ureteral stones. The ureters are dilated to the urinary bladder which is decompressed. Adrenal glands unremarkable. Stomach/Bowel: Colonic diverticulosis diffusely. No active diverticulitis. Stomach and small bowel decompressed, unremarkable. Vascular/Lymphatic: Aortic atherosclerosis. No evidence of aneurysm or adenopathy. Reproductive: Uterus and adnexa unremarkable.  No mass. Other: Free fluid noted in the pelvis anterior to the uterus. No free air. Musculoskeletal: No acute bony abnormality. IMPRESSION: Diffuse colonic diverticulosis.  No active diverticulitis. Bilateral hydronephrosis of unknown etiology. Ureters are dilated to the bladder. No visible stones. Bladder is decompressed. Small amount of free  fluid in the pelvis. Aortic atherosclerosis. Small right pleural effusion. Electronically Signed   By: Rolm Baptise M.D.   On: 09/19/2020 10:44   DG OR UROLOGY CYSTO IMAGE ([facility] ONLY)  Result Date: 09/24/2020 There is no interpretation for this exam.  This order is for images obtained during a surgical procedure.  Please See "Surgeries" Tab for more information regarding the procedure.    Labs:  CBC: Recent Labs    09/23/20 0459 09/24/20 0718 09/25/20 0411 09/26/20 0457  WBC 8.9 9.0 6.5 19.8*  HGB 12.0 12.7 12.5 11.3*  HCT 37.5 38.8 39.6 36.2  PLT 341 346 320 324    COAGS: Recent Labs    09/23/20 0459 09/23/20 1456 09/23/20 2155 09/24/20 0713 09/25/20 0825 09/26/20 0457  INR  --   --   --   --  1.2 1.2  APTT 38* 118* 111* 31  --   --     BMP: Recent Labs    09/23/20 0459 09/24/20 0718 09/25/20 0411 09/26/20 0457  NA 140 141 140 141  K 4.2 5.1 4.5 4.5  CL 107 107 108 112*  CO2 24 24 23 23   GLUCOSE 89 90 217* 128*  BUN 30* 30* 28* 27*  CALCIUM 8.7* 9.1 9.0 8.4*  CREATININE 2.93* 3.16* 2.68* 1.79*  GFRNONAA 16* 15* 18*  29*    LIVER FUNCTION TESTS: Recent Labs    09/19/20 0945  BILITOT 0.8  AST 25  ALT 21  ALKPHOS 49  PROT 7.2  ALBUMIN 3.8    TUMOR MARKERS: No results for input(s): AFPTM, CEA, CA199, CHROMGRNA in the last 8760 hours.  Assessment and Plan:  CALY PELLUM is a 75 y.o. female with past medical history significant for hypertension and atrial fibrillation who presented to the [facility] emergency department with left lower quadrant abdominal pain found to have bilateral pelvocaliectasis and ureterectasis.  Patient subsequent underwent retrograde placement of a right-sided ureteral stent however unsuccessful placement of a left-sided ureteral stent secondary to the presence of a bladder mass.  As such, patient now presents for image guided placement of left-sided nephrostomy catheter for urinary diversion purposes.  Continues to  report hematuria though denies flank pain.  She denies chest pain, shortness of breath, fever or chills.  Risks and benefits of left sided PCN placement was discussed with the patient including, but not limited to, infection, bleeding, significant bleeding causing loss or decrease in renal function or damage to adjacent structures.   All of the patient's questions were answered, patient is agreeable to proceed.  Consent signed and in chart.  Thank you for this interesting consult.  I greatly enjoyed meeting JASIA HILTUNEN and look forward to participating in their care.  A copy of this report was sent to the requesting provider on this date.  Electronically Signed: Sandi Mariscal, MD 09/26/2020, 1:53 PM   I spent a total of 20 Minutes in face to face in clinical consultation, greater than 50% of which was counseling/coordinating care for left sided PCN placement

## 2020-09-26 NOTE — Progress Notes (Signed)
Mobility Specialist - Progress Note   09/26/20 1200  Mobility  Activity Ambulated in hall  Level of Assistance Modified independent, requires aide device or extra time  Assistive Device None  Distance Ambulated (ft) 800 ft  Mobility Response Tolerated well  Mobility performed by Mobility specialist  $Mobility charge 1 Mobility    Pre-mobility: 78 HR, 95% SpO2 During mobility: 92 HR, 99% SpO2 Post-mobility: 96 HR, 99% SpO2   Pt received in bed on room air. Pt agreed to session. No pain, nausea, fatigue. Depressed. Pt modI in all transfers. No reports of dizziness or lightheadedness throughout. Pt ambulated 800' in hallway without AD. Slightly unsteady with use of furniture and rails, but does become more steady and independent with distance. No LOB noted. No c/o SOB, O2 maintained high 90s. RPE 5/10. Pt left in bed, needs in reach.    Kathee Delton Mobility Specialist 09/26/20, 12:55 PM

## 2020-09-27 ENCOUNTER — Other Ambulatory Visit: Payer: Self-pay | Admitting: Pathology

## 2020-09-27 DIAGNOSIS — F332 Major depressive disorder, recurrent severe without psychotic features: Secondary | ICD-10-CM | POA: Diagnosis not present

## 2020-09-27 LAB — CBC
HCT: 42.1 % (ref 36.0–46.0)
Hemoglobin: 13.4 g/dL (ref 12.0–15.0)
MCH: 30.8 pg (ref 26.0–34.0)
MCHC: 31.8 g/dL (ref 30.0–36.0)
MCV: 96.8 fL (ref 80.0–100.0)
Platelets: 299 10*3/uL (ref 150–400)
RBC: 4.35 MIL/uL (ref 3.87–5.11)
RDW: 12.9 % (ref 11.5–15.5)
WBC: 13.2 10*3/uL — ABNORMAL HIGH (ref 4.0–10.5)
nRBC: 0 % (ref 0.0–0.2)

## 2020-09-27 LAB — BASIC METABOLIC PANEL
Anion gap: 9 (ref 5–15)
BUN: 23 mg/dL (ref 8–23)
CO2: 23 mmol/L (ref 22–32)
Calcium: 8.7 mg/dL — ABNORMAL LOW (ref 8.9–10.3)
Chloride: 110 mmol/L (ref 98–111)
Creatinine, Ser: 1.2 mg/dL — ABNORMAL HIGH (ref 0.44–1.00)
GFR, Estimated: 47 mL/min — ABNORMAL LOW (ref >60)
Glucose, Bld: 94 mg/dL (ref 70–99)
Potassium: 4.1 mmol/L (ref 3.5–5.1)
Sodium: 142 mmol/L (ref 135–145)

## 2020-09-27 LAB — SURGICAL PATHOLOGY

## 2020-09-27 LAB — MAGNESIUM: Magnesium: 1.9 mg/dL (ref 1.7–2.4)

## 2020-09-27 LAB — PHOSPHORUS: Phosphorus: 2.8 mg/dL (ref 2.5–4.6)

## 2020-09-27 MED ORDER — BISACODYL 5 MG PO TBEC: 10.0000 mg | DELAYED_RELEASE_TABLET | Freq: Every day | ORAL | Status: DC | PRN

## 2020-09-27 MED ORDER — BISACODYL 10 MG RE SUPP
10.0000 mg | Freq: Every day | RECTAL | Status: DC | PRN
  Administered 2020-09-27: 10 mg via RECTAL
  Filled 2020-09-27: qty 1

## 2020-09-27 MED ORDER — POLYETHYLENE GLYCOL 3350 17 G PO PACK
17.0000 g | PACK | Freq: Two times a day (BID) | ORAL | Status: DC
  Administered 2020-09-27 – 2020-10-01 (×9): 17 g via ORAL
  Filled 2020-09-27 (×9): qty 1

## 2020-09-27 MED ORDER — MIRTAZAPINE 15 MG PO TABS
30.0000 mg | ORAL_TABLET | Freq: Every day | ORAL | Status: DC
  Administered 2020-09-27 – 2020-10-01 (×5): 30 mg via ORAL
  Filled 2020-09-27 (×5): qty 2

## 2020-09-27 MED ORDER — POLYETHYLENE GLYCOL 3350 17 G PO PACK: 17.0000 g | PACK | Freq: Two times a day (BID) | ORAL | Status: DC

## 2020-09-27 MED ORDER — BISACODYL 5 MG PO TBEC: 10.0000 mg | DELAYED_RELEASE_TABLET | Freq: Once | ORAL | Status: DC

## 2020-09-27 MED ORDER — MINERAL OIL RE ENEM
1.0000 | ENEMA | Freq: Once | RECTAL | Status: AC
  Administered 2020-09-27: 1 via RECTAL

## 2020-09-27 MED ORDER — BISACODYL 10 MG RE SUPP: 10.0000 mg | Freq: Once | RECTAL | Status: DC

## 2020-09-27 NOTE — Progress Notes (Signed)
Gloria Gill notified that patient still hasn't had BM >1 week despite multiple interventions (see MAR).  Order for bisacodyl, suppository given.

## 2020-09-27 NOTE — Progress Notes (Signed)
[facility] Progress Note  Patient: Gloria Gill    HQI:[id number]  DOA: 09/19/2020     Date of Service: the patient was seen and examined on 09/27/2020  Chief Complaint  Patient presents with  . Abdominal Pain  . SI   Brief hospital course:  Gloria Labarre [name] a 75 y.o.femalewith medical history significant foretoh (sober 3 years), a fib on eliquis, etoh cardiomyopathy, who presents with the above.  Few weeks of crampy suprapubic pain. Seen by pcp a week or so ago, clinical dx diverticulitis, started on augmentin. Didn't improved. Labs at f/u showed aki so referred to ED. Patient reports she was started on oxybutynin 3 weeks ago for urinary incontinence. No diarrhea or vomiting. No chest pain. No dysuria or fevers. Is making urine but thinks urinating less. No history kidney stones. Denies hematuria.  Told ED provider she's thinking of harming herself. Endorses SI and says plan is to drive into something with her car. 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 Denies alcohol or drug use.   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 Cr 3.08 from normal previously.  Cr around 3, improved to 2.68--1.79 --1.2 Cystoscopy 3/7 showing likely malignancy. TURBT performed, path pending. R ureteral stent placed, unable to stent on the left. K and bicarb wnl. Hematuria overnight. h/h stable. Pain resolved. Urine culture neg.  - maintain foley - holding home oxybutynin, lasix, losartan - monitor  kidney function - hold eliquis, will need re-start when safe to do so - IR consulted for left nephrostomy tube done on 3/9, tolerated procedure well - will f/u urology recs Panculture urine noticed in the left nephrostomy bag, mild hematuria Eliquis for now, resume Eliquis tomorrow a.m. 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 Per cardiology record most recent EF 40%. Appears compensated - hold lasix and losartan as above - cont home metoprolol  # Paroxysmal a fib # RVR rvr resolved with cardizem gtt, now off - hold eliquis as above  - cont home metop - cont cardizem oral which is new - cardiology has signed off, will need f/u with outpatient  # Constipation - cont miralax - started Dulcolax p.o. and suppository as needed Use enema if no results after suppository  Body mass index is 24.68 kg/m.         Diet: NPO Will resume diet after procedure DVT Prophylaxis: SCD, pharmacological prophylaxis contraindicated due to Risk of bleeding, anticipated procedure  Eliquis is on hold  Advance goals of care discussion: Full code  Family Communication: family was not present at bedside, at the time of interview.  The pt provided permission to discuss medical plan with the family. Opportunity was given to ask question and all questions were answered satisfactorily.   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 Discharge to home versus SNF depends on recovery, when cleared by urology.  Subjective: No overnight issues, patient stated pain is under control, denies any chest pain or shortness of breath. Patient  was having constipation has not moved bowels for around 1 week, we started her on laxatives yesterday, will try suppository and enema today   Physical Exam: General:  Alert, NAD.  Appear in no distress, affect appropriate Eyes: PERRLA ENT: Oral Mucosa Clear, moist  Neck: no JVD,  Cardiovascular: S1 and S2 Present, no Murmur,  Respiratory: good respiratory effort, Bilateral Air entry equal and Decreased, no Crackles, no wheezes Abdomen: Bowel Sound present, Soft and no tenderness, s/p Left PCN tube attached Skin: no rashes Extremities: no Pedal edema, no calf tenderness Neurologic: without any new focal findings Gait not checked due to patient safety concerns  Vitals:   09/26/20 1932 09/27/20 0417 09/27/20 0836 09/27/20 1032  BP: 131/72 (!) 149/86 (!) 142/86 (!) 134/95  Pulse: 93 66 71 89  Resp: 16 18 14 18   Temp: 98 F (36.7 C)  97.6 F (36.4 C) 97.7 F (36.5 C)  TempSrc: Oral  Oral Oral  SpO2: 98% 97% 98% 97%  Weight:      Height:        Intake/Output Summary (Last 24 hours) at 09/27/2020 1212 Last data filed at 09/27/2020 1020 Gross per 24 hour  Intake 258 ml  Output 3275 ml  Net -3017 ml   Filed Weights   09/24/20 1436 09/25/20 0404 09/26/20 1354  Weight: 76.7 kg 76.9 kg 76.7 kg    Data Reviewed: I have personally reviewed and interpreted daily labs, tele strips, imagings as discussed above. I reviewed all nursing notes, pharmacy notes, vitals, pertinent old records I have discussed plan of care as described above with RN and patient/family.  CBC: Recent Labs  Lab 09/23/20 0459 09/24/20 0718 09/25/20 0411 09/26/20 0457 09/27/20 0424  WBC 8.9 9.0 6.5 19.8* 13.2*  HGB 12.0 12.7 12.5 11.3* 13.4  HCT 37.5 38.8 39.6 36.2 42.1  MCV 96.2 94.4 97.5 98.6 96.8  PLT 341 346 320 324 623   Basic Metabolic Panel: Recent Labs  Lab 09/23/20 0459 09/24/20 0718 09/25/20 0411 09/26/20 0457 09/27/20 0424  NA 140 141 140 141 142  K 4.2 5.1 4.5 4.5 4.1  CL 107 107  108 112* 110  CO2 24 24 23 23 23   GLUCOSE 89 90 217* 128* 94  BUN 30* 30* 28* 27* 23  CREATININE 2.93* 3.16* 2.68* 1.79* 1.20*  CALCIUM 8.7* 9.1 9.0 8.4* 8.7*  MG  --   --   --   --  1.9  PHOS  --   --   --   --  2.8    Studies: [place] Intraoperative  Result Date: 09/26/2020 CLINICAL DATA:  Ultrasound was provided for use by the ordering physician.  No provider Interpretation or professional fees incurred.    IR NEPHROSTOMY PLACEMENT LEFT  Result Date: 09/26/2020 INDICATION: History of bladder mass, post failed attempt at left-sided ureteral stent placement (note, a right-sided ureteral stent was able to be placed). As such, the patient presents now for image guided placement of a left-sided nephrostomy catheter for urinary diversion purposes. EXAM: 1. ULTRASOUND GUIDANCE FOR PUNCTURE OF THE LEFT RENAL COLLECTING SYSTEM 2. LEFT PERCUTANEOUS NEPHROSTOMY TUBE PLACEMENT. COMPARISON:  CT abdomen and pelvis-09/19/2020 MEDICATIONS: The patient is currently admitted to the hospital receiving intravenous antibiotics; The antibiotic  was administered in an appropriate time frame prior to skin puncture. ANESTHESIA/SEDATION: Moderate (conscious) sedation was employed during this procedure. A total of Versed 1 mg and Fentanyl 50 mcg was administered intravenously. Moderate Sedation Time: 10 minutes. The patient's level of consciousness and vital signs were monitored continuously by radiology nursing throughout the procedure under my direct supervision. CONTRAST:  10 cc Isovue 300 - administered into the renal collecting system FLUOROSCOPY TIME:  1 minute, 6 seconds (95.6 mGy) COMPLICATIONS: None immediate. PROCEDURE: The procedure, risks, benefits, and alternatives were explained to the patient. Questions regarding the procedure were encouraged and answered. The patient understands and consents to the procedure. A timeout was performed prior to the initiation of the procedure. The left flank region was prepped and  draped in the usual sterile fashion and a sterile drape was applied covering the operative field. A sterile gown and sterile gloves were used for the procedure. Local anesthesia was provided with 1% Lidocaine with epinephrine. Ultrasound was used to localize the left kidney. Under direct ultrasound guidance, a 20 gauge needle was advanced into the renal collecting system. An ultrasound image documentation was performed. Access within the collecting system was confirmed with the efflux of urine followed by limited contrast injection. Over a Nitrex wire, the tract was dilated with an Accustick stent. Next, under intermittent fluoroscopic guidance and over a short Amplatz wire, the track was dilated ultimately allowing placement of a 10-French percutaneous nephrostomy catheter which was advanced to the level of the renal pelvis where the coil was formed and locked. Contrast was injected and several spot fluoroscopic images were obtained in various obliquities. The catheter was secured at the skin with a Prolene retention suture and stat lock device and connected to a gravity bag was placed. Dressings were applied. The patient tolerated procedure well without immediate postprocedural complication. FINDINGS: Ultrasound scanning demonstrates a mild to moderately dilated left renal collecting system, similar to abdominal CT performed 09/19/2020. Under a combination of ultrasound and fluoroscopic guidance, a posterior inferior calix was targeted allowing placement of a 10-French percutaneous nephrostomy catheter with end coiled and locked within the renal pelvis. Contrast injection confirmed appropriate positioning. IMPRESSION: Successful ultrasound and fluoroscopic guided placement of a left sided 10 French PCN. PLAN: - Maintain left-sided nephrostomy catheter to gravity bag. - The patient may return to interventional radiology for attempted antegrade placement of a ureteral stent in 3-4 weeks as deemed appropriate by  referring urologist, Dr. Erlene Quan. Electronically Signed   By: Sandi Mariscal M.D.   On: 09/26/2020 15:26    Scheduled Meds: . bisacodyl  10 mg Oral Once  . bisacodyl  10 mg Rectal Once  . Chlorhexidine Gluconate Cloth  6 each Topical Daily  . diltiazem  240 mg Oral Daily  . metoprolol succinate  100 mg Oral Daily  . mirtazapine  15 mg Oral QHS  . polyethylene glycol  17 g Oral BID  . sodium chloride flush  3 mL Intravenous Q12H  . sodium chloride flush  5 mL Intracatheter Q8H   Continuous Infusions: . sodium chloride     PRN Meds: sodium chloride, bisacodyl, bisacodyl, lip balm, morphine injection, ondansetron (ZOFRAN) IV, sodium chloride flush  Time spent: 35 minutes  Author: Val Riles. MD [facility] 09/27/2020 12:12 PM  To reach On-call, see care teams to locate the attending and reach out to them via [email or web address]. If 7PM-7AM, please contact night-coverage If you still have difficulty reaching the attending provider, please page the [facility] (Director on Call)  for [facility] on amion for assistance.

## 2020-09-27 NOTE — Progress Notes (Signed)
Urology Inpatient Progress Note  Subjective: Left PCN placed by IR yesterday.  She is afebrile, VSS. Creatinine down today, 1.20.  WC count down today, 13.2. Patient reports some PCN site tenderness without pain.  She is tolerating her right ureteral stent well without flank pain, bladder pain, dysuria, urgency, or frequency.  She reports constipation today.  Anti-infectives: Anti-infectives (From admission, onward)   Start     Dose/Rate Route Frequency Ordered Stop   09/24/20 1730  ceFAZolin (ANCEF) IVPB 2g/100 mL premix        2 g 200 mL/hr over 30 Minutes Intravenous  Once 09/24/20 1722 09/24/20 1804   09/24/20 1722  ceFAZolin (ANCEF) 2-4 GM/100ML-% IVPB       Note to Pharmacy: Register, Santiago Glad   : cabinet override      09/24/20 1722 09/24/20 1804      Current Facility-Administered Medications  Medication Dose Route Frequency Provider Last Rate Last Admin  . 0.9 %  sodium chloride infusion  250 mL Intravenous PRN Hollice Espy, MD      . Chlorhexidine Gluconate Cloth 2 % PADS 6 each  6 each Topical Daily Hollice Espy, MD   6 each at 09/26/20 1038  . diltiazem (CARDIZEM CD) 24 hr capsule 240 mg  240 mg Oral Daily Hollice Espy, MD   240 mg at 09/26/20 1009  . fentaNYL (SUBLIMAZE) injection    PRN Sandi Mariscal, MD   50 mcg at 09/26/20 1443  . lip balm (BLISTEX) ointment   Topical PRN Hollice Espy, MD      . metoprolol succinate (TOPROL-XL) 24 hr tablet 100 mg  100 mg Oral Daily Hollice Espy, MD   100 mg at 09/26/20 1009  . midazolam (VERSED) injection    PRN Sandi Mariscal, MD   1 mg at 09/26/20 1443  . mirtazapine (REMERON) tablet 15 mg  15 mg Oral QHS Hollice Espy, MD   15 mg at 09/26/20 2114  . morphine 4 MG/ML injection 4 mg  4 mg Intravenous Q4H PRN Hollice Espy, MD   4 mg at 09/20/20 0448  . ondansetron (ZOFRAN) injection 4 mg  4 mg Intravenous Q4H PRN Hollice Espy, MD   4 mg at 09/20/20 0108  . polyethylene glycol (MIRALAX / GLYCOLAX) packet 17 g  17 g Oral  Daily Hollice Espy, MD   17 g at 09/26/20 1720  . senna-docusate (Senokot-S) tablet 2 tablet  2 tablet Oral QHS Hollice Espy, MD   2 tablet at 09/26/20 2114  . sodium chloride flush (NS) 0.9 % injection 3 mL  3 mL Intravenous Q12H Hollice Espy, MD   3 mL at 09/26/20 2114  . sodium chloride flush (NS) 0.9 % injection 3 mL  3 mL Intravenous PRN Hollice Espy, MD   3 mL at 09/21/20 2107  . sodium chloride flush (NS) 0.9 % injection 5 mL  5 mL Intracatheter Q8H Sandi Mariscal, MD   5 mL at 09/27/20 0531   Objective: Vital signs in last 24 hours: Temp:  [97.5 F (36.4 C)-98.4 F (36.9 C)] 98 F (36.7 C) (03/09 1932) Pulse Rate:  [66-93] 66 (03/10 0417) Resp:  [15-19] 18 (03/10 0417) BP: (118-149)/(69-92) 149/86 (03/10 0417) SpO2:  [92 %-100 %] 97 % (03/10 0417) Weight:  [76.7 kg] 76.7 kg (03/09 1354)  Intake/Output from previous day: 03/09 0701 - 03/10 0700 In: 18 [I.V.:8] Out: 3600 [Urine:3600] Intake/Output this shift: No intake/output data recorded.  Physical Exam Vitals and nursing note reviewed.  Constitutional:  General: She is not in acute distress.    Appearance: She is not ill-appearing, toxic-appearing or diaphoretic.  HENT:     Head: Normocephalic and atraumatic.  Pulmonary:     Effort: Pulmonary effort is normal. No respiratory distress.  Skin:    General: Skin is warm and dry.  Neurological:     Mental Status: She is alert and oriented to person, place, and time.  Psychiatric:        Mood and Affect: Mood normal.        Behavior: Behavior normal.    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 Erlene Quan and left PCN placement with Dr. Pascal Lux.  Surgical pathology pending and creatinine improving with bilateral upper tract decompression in place.  She appears to be tolerating her stent well.  Okay to continue bowel regimen and continue with plans for discharge this afternoon barring clinical decline.  She is already scheduled for outpatient follow-up with Dr. Erlene Quan next week to review pathology.  Per pharmacy, continue to hold anticoagulation until at least tomorrow, 3/11.  Will defer to cardiology re: anticoagulation use.  Debroah Loop, PA-C 09/27/2020

## 2020-09-27 NOTE — Care Management Important Message (Signed)
Important Message  Patient Details  Name: Gloria Gill MRN: [id number] Date of Birth: [birth year]   Medicare Important Message Given:  Yes     Dannette Barbara 09/27/2020, 12:56 PM

## 2020-09-27 NOTE — Consult Note (Signed)
[facility] Psych[facility] Consult   Reason for Consult: Follow-up consult 75 year old woman who presented to the hospital with depression and suicidal ideation and pain Referring Physician: Dwyane Dee Patient Identification: Gloria Gill MRN:  [id number] Principal Diagnosis: Severe recurrent major depression without psychotic features [facility]) Diagnosis:  Principal Problem:   Severe recurrent major depression without psychotic features ([place]) Active Problems:   Alcohol abuse   Cardiomyopathy ([place])   AKI (acute kidney injury) ([place])   HFrEF (heart failure with reduced ejection fraction) ([place])   Hydronephrosis   Suicidal ideation   Somatic symptom disorder   Atrial fibrillation with rapid ventricular response ([place])   Total Time spent with patient: 30 minutes  Subjective:   Gloria Gill is a 75 y.o. female patient admitted with "I feel terrible".  HPI: Follow-up for this patient seen when she was admitted.  Patient continues to report that her mood is "terrible".  Says that she feels depressed and down all the time.  Feels hopeless.  Has frequent panic attack-like symptoms.  When asked about suicidality she says that she still feels that if she leaves the hospital she is likely to try and kill her self in her car.  Patient was tearful throughout the interview.  Past Psychiatric History: Past history of depression  Risk to Self:   Risk to Others:   Prior Inpatient Therapy:   Prior Outpatient Therapy:    Past Medical History:  Past Medical History:  Diagnosis Date  . Atrial fibrillation ([place])   . Hypertension     Past Surgical History:  Procedure Laterality Date  . APPENDECTOMY    . BREAST SURGERY    . CYSTOGRAM  09/24/2020   Procedure: CYSTOGRAM;  Surgeon: Hollice Espy, MD;  Location: [facility] ORS;  Service: Urology;;  . Consuela Mimes W/ RETROGRADES Bilateral 09/24/2020   Procedure: CYSTOSCOPY WITH RETROGRADE PYELOGRAM;  Surgeon: Hollice Espy, MD;  Location: [facility] ORS;   Service: Urology;  Laterality: Bilateral;  . CYSTOSCOPY WITH BIOPSY N/A 09/24/2020   Procedure: CYSTOSCOPY WITH BIOPSY;  Surgeon: Hollice Espy, MD;  Location: [facility] ORS;  Service: Urology;  Laterality: N/A;  . CYSTOSCOPY WITH STENT PLACEMENT Bilateral 09/24/2020   Procedure: CYSTOSCOPY WITH RIGHT STENT PLACEMENT;  Surgeon: Hollice Espy, MD;  Location: [facility] ORS;  Service: Urology;  Laterality: Bilateral;  . IR NEPHROSTOMY PLACEMENT LEFT  09/26/2020  . REDUCTION MAMMAPLASTY Bilateral 1981   pt stated she had implants with these scars/had implants removed 2009  . TRANSURETHRAL RESECTION OF BLADDER TUMOR N/A 09/24/2020   Procedure: TRANSURETHRAL RESECTION OF BLADDER TUMOR (TURBT);  Surgeon: Hollice Espy, MD;  Location: [facility] ORS;  Service: Urology;  Laterality: N/A;   Family History:  Family History  Problem Relation Age of Onset  . Breast cancer Neg Hx    Family Psychiatric  History: See previous Social History:  Social History   Substance and Sexual Activity  Alcohol Use Not Currently     Social History   Substance and Sexual Activity  Drug Use Never    Social History   Socioeconomic History  . Marital status: Married    Spouse name: Not on file  . Number of children: Not on file  . Years of education: Not on file  . Highest education level: Not on file  Occupational History  . Not on file  Tobacco Use  . Smoking status: Never Smoker  . Smokeless tobacco: Never Used  Substance and Sexual Activity  . Alcohol use: Not Currently  . Drug use: Never  .  Sexual activity: Not on file  Other Topics Concern  . Not on file  Social History Narrative  . Not on file   Social Determinants of Health   Financial Resource Strain: Not on file  Food Insecurity: Not on file  Transportation Needs: Not on file  Physical Activity: Not on file  Stress: Not on file  Social Connections: Not on file   Additional Social History:    Allergies:   Allergies  Allergen Reactions  .  Lisinopril Nausea Only    Labs:  Results for orders placed or performed during the hospital encounter of 09/19/20 (from the past 48 hour(s))  Basic metabolic panel     Status: Abnormal   Collection Time: 09/26/20  4:57 AM  Result Value Ref Range   Sodium 141 135 - 145 mmol/L   Potassium 4.5 3.5 - 5.1 mmol/L   Chloride 112 (H) 98 - 111 mmol/L   CO2 23 22 - 32 mmol/L   Glucose, Bld 128 (H) 70 - 99 mg/dL    Comment: Glucose reference range applies only to samples taken after fasting for at least 8 hours.   BUN 27 (H) 8 - 23 mg/dL   Creatinine, Ser 1.79 (H) 0.44 - 1.00 mg/dL   Calcium 8.4 (L) 8.9 - 10.3 mg/dL   GFR, Estimated 29 (L) >60 mL/min    Comment: (NOTE) Calculated using the CKD-EPI Creatinine Equation (2021)    Anion gap 6 5 - 15    Comment: Performed at [facility], [address] [place], [place] [address]  CBC     Status: Abnormal   Collection Time: 09/26/20  4:57 AM  Result Value Ref Range   WBC 19.8 (H) 4.0 - 10.5 K/uL   RBC 3.67 (L) 3.87 - 5.11 MIL/uL   Hemoglobin 11.3 (L) 12.0 - 15.0 g/dL   HCT 36.2 36.0 - 46.0 %   MCV 98.6 80.0 - 100.0 fL   MCH 30.8 26.0 - 34.0 pg   MCHC 31.2 30.0 - 36.0 g/dL   RDW 12.8 11.5 - 15.5 %   Platelets 324 150 - 400 K/uL   nRBC 0.0 0.0 - 0.2 %    Comment: Performed at [facility], [address] [place], [place] [address]  Protime-INR     Status: None   Collection Time: 09/26/20  4:57 AM  Result Value Ref Range   Prothrombin Time 14.3 11.4 - 15.2 seconds   INR 1.2 0.8 - 1.2    Comment: Performed at [facility] [address] [place], [place] [address]  Basic metabolic panel     Status: Abnormal   Collection Time: 09/27/20  4:24 AM  Result Value Ref Range   Sodium 142 135 - 145 mmol/L   Potassium 4.1 3.5 - 5.1 mmol/L   Chloride 110 98 - 111 mmol/L   CO2 23 22 - 32 mmol/L   Glucose, Bld 94 70 - 99 mg/dL    Comment: Glucose reference range applies only to samples taken after fasting for at  least 8 hours.   BUN 23 8 - 23 mg/dL   Creatinine, Ser 1.20 (H) 0.44 - 1.00 mg/dL   Calcium 8.7 (L) 8.9 - 10.3 mg/dL   GFR, Estimated 47 (L) >60 mL/min    Comment: (NOTE) Calculated using the CKD-EPI Creatinine Equation (2021)    Anion gap 9 5 - 15    Comment: Performed at [facility], [address] [place], [place] [address]  CBC  Status: Abnormal   Collection Time: 09/27/20  4:24 AM  Result Value Ref Range   WBC 13.2 (H) 4.0 - 10.5 K/uL   RBC 4.35 3.87 - 5.11 MIL/uL   Hemoglobin 13.4 12.0 - 15.0 g/dL   HCT 42.1 36.0 - 46.0 %   MCV 96.8 80.0 - 100.0 fL   MCH 30.8 26.0 - 34.0 pg   MCHC 31.8 30.0 - 36.0 g/dL   RDW 12.9 11.5 - 15.5 %   Platelets 299 150 - 400 K/uL   nRBC 0.0 0.0 - 0.2 %    Comment: Performed at [facility], [address] [place], [place] [address]  Magnesium     Status: None   Collection Time: 09/27/20  4:24 AM  Result Value Ref Range   Magnesium 1.9 1.7 - 2.4 mg/dL    Comment: Performed at [facility], [address] [place], [place] [address]  Phosphorus     Status: None   Collection Time: 09/27/20  4:24 AM  Result Value Ref Range   Phosphorus 2.8 2.5 - 4.6 mg/dL    Comment: Performed at [facility], [address] [place], [place] [address]    Current Facility-Administered Medications  Medication Dose Route Frequency Provider Last Rate Last Admin  . 0.9 %  sodium chloride infusion  250 mL Intravenous PRN Hollice Espy, MD      . bisacodyl (DULCOLAX) EC tablet 10 mg  10 mg Oral Daily PRN Val Riles, MD      . bisacodyl (DULCOLAX) EC tablet 10 mg  10 mg Oral Once Val Riles, MD      . bisacodyl (DULCOLAX) suppository 10 mg  10 mg Rectal Daily PRN Val Riles, MD   10 mg at 09/27/20 0906  . bisacodyl (DULCOLAX) suppository 10 mg  10 mg Rectal Once Val Riles, MD      . Chlorhexidine Gluconate Cloth 2 % PADS 6 each  6 each Topical Daily Hollice Espy, MD   6 each at 09/27/20 [phone]  . diltiazem  (CARDIZEM CD) 24 hr capsule 240 mg  240 mg Oral Daily Hollice Espy, MD   240 mg at 09/27/20 0837  . lip balm (BLISTEX) ointment   Topical PRN Hollice Espy, MD      . metoprolol succinate (TOPROL-XL) 24 hr tablet 100 mg  100 mg Oral Daily Hollice Espy, MD   100 mg at 09/27/20 0837  . mineral oil enema 1 enema  1 enema Rectal Once Val Riles, MD      . mirtazapine (REMERON) tablet 30 mg  30 mg Oral QHS Aris Even T, MD      . morphine 4 MG/ML injection 4 mg  4 mg Intravenous Q4H PRN Hollice Espy, MD   4 mg at 09/20/20 0448  . ondansetron (ZOFRAN) injection 4 mg  4 mg Intravenous Q4H PRN Hollice Espy, MD   4 mg at 09/20/20 0108  . polyethylene glycol (MIRALAX / GLYCOLAX) packet 17 g  17 g Oral BID Val Riles, MD      . sodium chloride flush (NS) 0.9 % injection 3 mL  3 mL Intravenous Q12H Hollice Espy, MD   3 mL at 09/27/20 0954  . sodium chloride flush (NS) 0.9 % injection 3 mL  3 mL Intravenous PRN Hollice Espy, MD   3 mL at 09/21/20 2107  . sodium chloride flush (NS) 0.9 % injection 5 mL  5 mL Intracatheter Q8H Sandi Mariscal, MD   5 mL at 09/27/20 [phone]  Musculoskeletal: Strength & Muscle Tone: decreased Gait & Station: unsteady Patient leans: N/A            Psychiatric Specialty Exam:  Presentation  General Appearance: No data recorded Eye Contact:No data recorded Speech:No data recorded Speech Volume:No data recorded Handedness:No data recorded  Mood and Affect  Mood:No data recorded Affect:No data recorded  Thought Process  Thought Processes:No data recorded Descriptions of Associations:No data recorded Orientation:No data recorded Thought Content:No data recorded History of Schizophrenia/Schizoaffective disorder:No data recorded Duration of Psychotic Symptoms:No data recorded Hallucinations:No data recorded Ideas of Reference:No data recorded Suicidal Thoughts:No data recorded Homicidal Thoughts:No data recorded  Sensorium   Memory:No data recorded Judgment:No data recorded Insight:No data recorded  Executive Functions  Concentration:No data recorded Attention Span:No data recorded Recall:No data recorded Fund of Knowledge:No data recorded Language:No data recorded  Psychomotor Activity  Psychomotor Activity:No data recorded  Assets  Assets:No data recorded  Sleep  Sleep:No data recorded  Physical Exam: Physical Exam Vitals and nursing note reviewed.  Constitutional:      Appearance: Normal appearance.  HENT:     Head: Normocephalic and atraumatic.     Mouth/Throat:     Pharynx: Oropharynx is clear.  Eyes:     Pupils: Pupils are equal, round, and reactive to light.  Cardiovascular:     Rate and Rhythm: Normal rate and regular rhythm.  Pulmonary:     Effort: Pulmonary effort is normal.     Breath sounds: Normal breath sounds.  Abdominal:     General: Abdomen is flat.     Palpations: Abdomen is soft.  Musculoskeletal:        General: Normal range of motion.  Skin:    General: Skin is warm and dry.  Neurological:     General: No focal deficit present.     Mental Status: She is alert. Mental status is at baseline.  Psychiatric:        Attention and Perception: She is inattentive.        Mood and Affect: Mood is depressed.        Thought Content: Thought content includes suicidal ideation. Thought content includes suicidal plan.        Cognition and Memory: Cognition is impaired.        Judgment: Judgment is impulsive.    Review of Systems  Constitutional: Positive for malaise/fatigue.  HENT: Negative.   Eyes: Negative.   Respiratory: Negative.   Cardiovascular: Negative.   Gastrointestinal: Negative.   Musculoskeletal: Positive for back pain and falls.  Skin: Negative.   Neurological: Negative.   Psychiatric/Behavioral: Positive for depression and suicidal ideas. The patient is nervous/anxious.    Blood pressure (!) 90/58, pulse 62, temperature 98.1 F (36.7 C), resp. rate  17, height 5' 9.5" (1.765 m), weight 76.7 kg, SpO2 96 %. Body mass index is 24.6 kg/m.  Treatment Plan Summary: Plan Patient is continuing to endorse severe depression.  This is the first time I have seen her in a few days so I am not sure if this is a consistent thing but today she is still reporting suicidal ideation.  Supportive therapy provided and I am increasing her Remeron to 30 mg.  I am afraid this patient probably is going to need referral to a geriatric psychiatry ward at discharge as she currently appears unsafe to go home.  Disposition: Recommend psychiatric Inpatient admission when medically cleared. Supportive therapy provided about ongoing stressors.  Alethia Berthold, MD 09/27/2020 5:16 PM

## 2020-09-27 NOTE — Progress Notes (Signed)
Throughout shift patient intermittently tearful and needs a lot of emotional support from staff after being told she has cancer.  She has seen psychiatry and chaplain today.  Continues to endorse that if she is discharged she will kill herself, but states that she will not harm herself while she is here. Suicidal ideation is consistent with what patient reported to Dr. Weber Cooks in psychiatry.

## 2020-09-28 ENCOUNTER — Inpatient Hospital Stay: Payer: Medicare PPO

## 2020-09-28 DIAGNOSIS — R45851 Suicidal ideations: Secondary | ICD-10-CM

## 2020-09-28 DIAGNOSIS — I10 Essential (primary) hypertension: Secondary | ICD-10-CM

## 2020-09-28 DIAGNOSIS — F1011 Alcohol abuse, in remission: Secondary | ICD-10-CM

## 2020-09-28 DIAGNOSIS — C579 Malignant neoplasm of female genital organ, unspecified: Secondary | ICD-10-CM

## 2020-09-28 DIAGNOSIS — I4891 Unspecified atrial fibrillation: Secondary | ICD-10-CM

## 2020-09-28 DIAGNOSIS — F332 Major depressive disorder, recurrent severe without psychotic features: Secondary | ICD-10-CM | POA: Diagnosis not present

## 2020-09-28 DIAGNOSIS — G893 Neoplasm related pain (acute) (chronic): Secondary | ICD-10-CM

## 2020-09-28 DIAGNOSIS — N1330 Unspecified hydronephrosis: Secondary | ICD-10-CM

## 2020-09-28 LAB — BASIC METABOLIC PANEL
Anion gap: 9 (ref 5–15)
BUN: 18 mg/dL (ref 8–23)
CO2: 25 mmol/L (ref 22–32)
Calcium: 8.8 mg/dL — ABNORMAL LOW (ref 8.9–10.3)
Chloride: 106 mmol/L (ref 98–111)
Creatinine, Ser: 1.07 mg/dL — ABNORMAL HIGH (ref 0.44–1.00)
GFR, Estimated: 54 mL/min — ABNORMAL LOW (ref >60)
Glucose, Bld: 94 mg/dL (ref 70–99)
Potassium: 4.1 mmol/L (ref 3.5–5.1)
Sodium: 140 mmol/L (ref 135–145)

## 2020-09-28 LAB — CBC
HCT: 42.3 % (ref 36.0–46.0)
Hemoglobin: 13.7 g/dL (ref 12.0–15.0)
MCH: 31.1 pg (ref 26.0–34.0)
MCHC: 32.4 g/dL (ref 30.0–36.0)
MCV: 96.1 fL (ref 80.0–100.0)
Platelets: 295 10*3/uL (ref 150–400)
RBC: 4.4 MIL/uL (ref 3.87–5.11)
RDW: 12.7 % (ref 11.5–15.5)
WBC: 11.7 10*3/uL — ABNORMAL HIGH (ref 4.0–10.5)
nRBC: 0 % (ref 0.0–0.2)

## 2020-09-28 LAB — MAGNESIUM: Magnesium: 1.7 mg/dL (ref 1.7–2.4)

## 2020-09-28 LAB — PHOSPHORUS: Phosphorus: 2.9 mg/dL (ref 2.5–4.6)

## 2020-09-28 MED ORDER — MINERAL OIL RE ENEM
1.0000 | ENEMA | Freq: Once | RECTAL | Status: AC
  Administered 2020-09-28: 1 via RECTAL

## 2020-09-28 MED ORDER — FLEET ENEMA 7-19 GM/118ML RE ENEM
1.0000 | ENEMA | Freq: Once | RECTAL | Status: AC
  Administered 2020-09-28: 1 via RECTAL

## 2020-09-28 NOTE — Progress Notes (Signed)
Urology Inpatient Progress Note  Subjective: No acute events overnight.  Creatinine down today, 1.07.  WBC count down today, 11.7. Foley catheter has been removed since yesterday.  She is voiding spontaneously without difficulty or discomfort. She reports today that she is not sure she wants to keep living.  She states she is having discomfort at the site of her left PCN and wonders when this will be removed.  She states she did not have a bowel movement yesterday but reports abdominal bloating. Psychiatry has reevaluated the patient and recommends geriatric psychiatry admission once medically cleared due to ongoing severe depression and suicidal ideation.  Anti-infectives: Anti-infectives (From admission, onward)   Start     Dose/Rate Route Frequency Ordered Stop   09/24/20 1730  ceFAZolin (ANCEF) IVPB 2g/100 mL premix        2 g 200 mL/hr over 30 Minutes Intravenous  Once 09/24/20 1722 09/24/20 1804   09/24/20 1722  ceFAZolin (ANCEF) 2-4 GM/100ML-% IVPB       Note to Pharmacy: Register, Santiago Glad   : cabinet override      09/24/20 1722 09/24/20 1804      Current Facility-Administered Medications  Medication Dose Route Frequency Provider Last Rate Last Admin  . 0.9 %  sodium chloride infusion  250 mL Intravenous PRN Hollice Espy, MD      . bisacodyl (DULCOLAX) EC tablet 10 mg  10 mg Oral Daily PRN Val Riles, MD      . bisacodyl (DULCOLAX) suppository 10 mg  10 mg Rectal Daily PRN Val Riles, MD   10 mg at 09/27/20 0906  . Chlorhexidine Gluconate Cloth 2 % PADS 6 each  6 each Topical Daily Hollice Espy, MD   6 each at 09/27/20 [phone]  . diltiazem (CARDIZEM CD) 24 hr capsule 240 mg  240 mg Oral Daily Hollice Espy, MD   240 mg at 09/27/20 0837  . lip balm (BLISTEX) ointment   Topical PRN Hollice Espy, MD      . metoprolol succinate (TOPROL-XL) 24 hr tablet 100 mg  100 mg Oral Daily Hollice Espy, MD   100 mg at 09/27/20 0837  . mirtazapine (REMERON) tablet 30 mg  30 mg Oral  QHS Clapacs, Madie [place], MD   30 mg at 09/27/20 2019  . morphine 4 MG/ML injection 4 mg  4 mg Intravenous Q4H PRN Hollice Espy, MD   4 mg at 09/28/20 0521  . ondansetron (ZOFRAN) injection 4 mg  4 mg Intravenous Q4H PRN Hollice Espy, MD   4 mg at 09/20/20 0108  . polyethylene glycol (MIRALAX / GLYCOLAX) packet 17 g  17 g Oral BID Val Riles, MD   17 g at 09/27/20 2019  . sodium chloride flush (NS) 0.9 % injection 3 mL  3 mL Intravenous Q12H Hollice Espy, MD   3 mL at 09/27/20 2020  . sodium chloride flush (NS) 0.9 % injection 3 mL  3 mL Intravenous PRN Hollice Espy, MD   3 mL at 09/21/20 2107  . sodium chloride flush (NS) 0.9 % injection 5 mL  5 mL Intracatheter Q8H Sandi Mariscal, MD   5 mL at 09/28/20 0508   Objective: Vital signs in last 24 hours: Temp:  [97.6 F (36.4 C)-98.1 F (36.7 C)] 98 F (36.7 C) (03/11 0739) Pulse Rate:  [62-89] 81 (03/11 0739) Resp:  [14-18] 16 (03/11 0739) BP: (90-138)/(58-95) 138/71 (03/11 0739) SpO2:  [96 %-98 %] 96 % (03/11 0739)  Intake/Output from previous day: 03/10 0701 - 03/11  0700 In: 728 [P.O.:720; I.V.:8] Out: 2900 [Urine:2900] Intake/Output this shift: No intake/output data recorded.  Physical Exam Vitals and nursing note reviewed.  Constitutional:      General: She is not in acute distress.    Appearance: She is not ill-appearing, toxic-appearing or diaphoretic.  HENT:     Head: Normocephalic and atraumatic.  Pulmonary:     Effort: Pulmonary effort is normal. No respiratory distress.  Abdominal:     General: There is no distension.     Palpations: Abdomen is soft.     Tenderness: There is abdominal tenderness (Diffuse). There is no guarding or rebound.  Skin:    General: Skin is warm and dry.  Neurological:     Mental Status: She is alert and oriented to person, place, and time.  Psychiatric:        Mood and Affect: Mood is depressed. Affect is tearful.    Lab Results:  Recent Labs    09/27/20 0424 09/28/20 0357   WBC 13.2* 11.7*  HGB 13.4 13.7  HCT 42.1 42.3  PLT 299 295   BMET Recent Labs    09/27/20 0424 09/28/20 0357  NA 142 140  K 4.1 4.1  CL 110 106  CO2 23 25  GLUCOSE 94 94  BUN 23 18  CREATININE 1.20* 1.07*  CALCIUM 8.7* 8.8*   PT/INR Recent Labs    09/26/20 0457  LABPROT 14.3  INR 1.2   Assessment & Plan: 75 year old female with AKI and bilateral hydronephrosis due to obstructing trigonal bladder mass, now s/p cystoscopy, TURBT, and right ureteral stent placement with Dr. Erlene Quan and left PCN placement with Dr. Pascal Lux.  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  Creatinine continues to downtrend following bilateral upper tract decompression.  Patient has improved urologically following intervention as above with no need for further intervention at this time.  Additionally, she continues to report constipation that has not been responsive to bowel regimen so far.  Okay to follow-up with urology as an outpatient.  We discussed that she will keep her left nephrostomy tube indefinitely pending any improvement in her obstructing trigonal bladder mass.  I explained that she will require periodic nephrostomy tube changes with IR and we will arrange these for her.  Agree with med onc consult for management of malignancy as above given likely psych admission for ongoing severe depression and suicidal ideation.  Debroah Loop, PA-C 09/28/2020

## 2020-09-28 NOTE — Progress Notes (Signed)
Mobility Specialist - Progress Note   09/28/20 1257  Mobility  Activity Refused mobility  Mobility performed by Mobility specialist    Pt politely declined mobility d/t abdominal pain. Motivated to attempt session at a later time.    Kathee Delton Mobility Specialist 09/28/20, 12:58 PM

## 2020-09-28 NOTE — Consult Note (Signed)
[facility] Consult   Reason for Consult: Follow-up note for this patient with multiple medical issues and depression Referring Physician: Dwyane Dee Patient Identification: Gloria Gill MRN:  [id number] Principal Diagnosis: Severe recurrent major depression without psychotic features [facility]) Diagnosis:  Principal Problem:   Severe recurrent major depression without psychotic features ([place]) Active Problems:   Alcohol abuse   Cardiomyopathy ([place])   AKI (acute kidney injury) ([place])   HFrEF (heart failure with reduced ejection fraction) ([place])   Hydronephrosis   Suicidal ideation   Somatic symptom disorder   Atrial fibrillation with rapid ventricular response ([place])   Total Time spent with patient: 30 minutes  Subjective:   Gloria Gill is a 75 y.o. female patient admitted with "I am a little better today".  HPI: Patient seen chart reviewed.  Patient was awake when I came in and tells me she is feeling better today than yesterday.  She looks like she is feeling significantly better.  She is more relaxed.  She says that her pain is slightly better today.  She still has suicidal thoughts and depression but is less stressed about it and feels a little more hopeful than she did before.  Past Psychiatric History: Past history of recurrent spells of depression  Risk to Self:   Risk to Others:   Prior Inpatient Therapy:   Prior Outpatient Therapy:    Past Medical History:  Past Medical History:  Diagnosis Date  . Atrial fibrillation ([place])   . Hypertension     Past Surgical History:  Procedure Laterality Date  . APPENDECTOMY    . BREAST SURGERY    . CYSTOGRAM  09/24/2020   Procedure: CYSTOGRAM;  Surgeon: Hollice Espy, MD;  Location: [facility] ORS;  Service: Urology;;  . Consuela Mimes W/ RETROGRADES Bilateral 09/24/2020   Procedure: CYSTOSCOPY WITH RETROGRADE PYELOGRAM;  Surgeon: Hollice Espy, MD;  Location: [facility] ORS;  Service: Urology;  Laterality: Bilateral;   . CYSTOSCOPY WITH BIOPSY N/A 09/24/2020   Procedure: CYSTOSCOPY WITH BIOPSY;  Surgeon: Hollice Espy, MD;  Location: [facility] ORS;  Service: Urology;  Laterality: N/A;  . CYSTOSCOPY WITH STENT PLACEMENT Bilateral 09/24/2020   Procedure: CYSTOSCOPY WITH RIGHT STENT PLACEMENT;  Surgeon: Hollice Espy, MD;  Location: [facility] ORS;  Service: Urology;  Laterality: Bilateral;  . IR NEPHROSTOMY PLACEMENT LEFT  09/26/2020  . REDUCTION MAMMAPLASTY Bilateral 1981   pt stated she had implants with these scars/had implants removed 2009  . TRANSURETHRAL RESECTION OF BLADDER TUMOR N/A 09/24/2020   Procedure: TRANSURETHRAL RESECTION OF BLADDER TUMOR (TURBT);  Surgeon: Hollice Espy, MD;  Location: [facility] ORS;  Service: Urology;  Laterality: N/A;   Family History:  Family History  Problem Relation Age of Onset  . Breast cancer Neg Hx    Family Psychiatric  History: See previous Social History:  Social History   Substance and Sexual Activity  Alcohol Use Not Currently     Social History   Substance and Sexual Activity  Drug Use Never    Social History   Socioeconomic History  . Marital status: Married    Spouse name: Not on file  . Number of children: Not on file  . Years of education: Not on file  . Highest education level: Not on file  Occupational History  . Not on file  Tobacco Use  . Smoking status: Never Smoker  . Smokeless tobacco: Never Used  Substance and Sexual Activity  . Alcohol use: Not Currently  . Drug use: Never  . Sexual activity: Not on  file  Other Topics Concern  . Not on file  Social History Narrative  . Not on file   Social Determinants of Health   Financial Resource Strain: Not on file  Food Insecurity: Not on file  Transportation Needs: Not on file  Physical Activity: Not on file  Stress: Not on file  Social Connections: Not on file   Additional Social History:    Allergies:   Allergies  Allergen Reactions  . Lisinopril Nausea Only    Labs:  Results for  orders placed or performed during the hospital encounter of 09/19/20 (from the past 48 hour(s))  Basic metabolic panel     Status: Abnormal   Collection Time: 09/27/20  4:24 AM  Result Value Ref Range   Sodium 142 135 - 145 mmol/L   Potassium 4.1 3.5 - 5.1 mmol/L   Chloride 110 98 - 111 mmol/L   CO2 23 22 - 32 mmol/L   Glucose, Bld 94 70 - 99 mg/dL    Comment: Glucose reference range applies only to samples taken after fasting for at least 8 hours.   BUN 23 8 - 23 mg/dL   Creatinine, Ser 1.20 (H) 0.44 - 1.00 mg/dL   Calcium 8.7 (L) 8.9 - 10.3 mg/dL   GFR, Estimated 47 (L) >60 mL/min    Comment: (NOTE) Calculated using the CKD-EPI Creatinine Equation (2021)    Anion gap 9 5 - 15    Comment: Performed at [facility], [address] [place], [place] [address]  CBC     Status: Abnormal   Collection Time: 09/27/20  4:24 AM  Result Value Ref Range   WBC 13.2 (H) 4.0 - 10.5 K/uL   RBC 4.35 3.87 - 5.11 MIL/uL   Hemoglobin 13.4 12.0 - 15.0 g/dL   HCT 42.1 36.0 - 46.0 %   MCV 96.8 80.0 - 100.0 fL   MCH 30.8 26.0 - 34.0 pg   MCHC 31.8 30.0 - 36.0 g/dL   RDW 12.9 11.5 - 15.5 %   Platelets 299 150 - 400 K/uL   nRBC 0.0 0.0 - 0.2 %    Comment: Performed at [facility], [address], [place], [place] [address]  Magnesium     Status: None   Collection Time: 09/27/20  4:24 AM  Result Value Ref Range   Magnesium 1.9 1.7 - 2.4 mg/dL    Comment: Performed at [facility], [address], [place], [place] [address]  Phosphorus     Status: None   Collection Time: 09/27/20  4:24 AM  Result Value Ref Range   Phosphorus 2.8 2.5 - 4.6 mg/dL    Comment: Performed at [facility], [address] [place], [place] [address]  Basic metabolic panel     Status: Abnormal   Collection Time: 09/28/20  3:57 AM  Result Value Ref Range   Sodium 140 135 - 145 mmol/L   Potassium 4.1 3.5 - 5.1 mmol/L   Chloride 106 98 - 111 mmol/L   CO2 25 22 - 32 mmol/L    Glucose, Bld 94 70 - 99 mg/dL    Comment: Glucose reference range applies only to samples taken after fasting for at least 8 hours.   BUN 18 8 - 23 mg/dL   Creatinine, Ser 1.07 (H) 0.44 - 1.00 mg/dL   Calcium 8.8 (L) 8.9 - 10.3 mg/dL   GFR, Estimated 54 (L) >60 mL/min    Comment: (NOTE) Calculated using the CKD-EPI Creatinine Equation (2021)  Anion gap 9 5 - 15    Comment: Performed at [facility], [address] [place], [place] [address]  CBC     Status: Abnormal   Collection Time: 09/28/20  3:57 AM  Result Value Ref Range   WBC 11.7 (H) 4.0 - 10.5 K/uL   RBC 4.40 3.87 - 5.11 MIL/uL   Hemoglobin 13.7 12.0 - 15.0 g/dL   HCT 42.3 36.0 - 46.0 %   MCV 96.1 80.0 - 100.0 fL   MCH 31.1 26.0 - 34.0 pg   MCHC 32.4 30.0 - 36.0 g/dL   RDW 12.7 11.5 - 15.5 %   Platelets 295 150 - 400 K/uL   nRBC 0.0 0.0 - 0.2 %    Comment: Performed at [facility], [address] [place], [place] [address]  Magnesium     Status: None   Collection Time: 09/28/20  3:57 AM  Result Value Ref Range   Magnesium 1.7 1.7 - 2.4 mg/dL    Comment: Performed at [facility], [address] [place], [place] [address]  Phosphorus     Status: None   Collection Time: 09/28/20  3:57 AM  Result Value Ref Range   Phosphorus 2.9 2.5 - 4.6 mg/dL    Comment: Performed at [facility], [address], [place], [place] [address]    Current Facility-Administered Medications  Medication Dose Route Frequency Provider Last Rate Last Admin  . 0.9 %  sodium chloride infusion  250 mL Intravenous PRN Hollice Espy, MD      . bisacodyl (DULCOLAX) EC tablet 10 mg  10 mg Oral Daily PRN Val Riles, MD      . bisacodyl (DULCOLAX) suppository 10 mg  10 mg Rectal Daily PRN Val Riles, MD   10 mg at 09/27/20 0906  . Chlorhexidine Gluconate Cloth 2 % PADS 6 each  6 each Topical Daily Hollice Espy, MD   6 each at 09/28/20 1023  . diltiazem (CARDIZEM CD) 24 hr capsule 240 mg  240 mg  Oral Daily Hollice Espy, MD   240 mg at 09/28/20 1023  . lip balm (BLISTEX) ointment   Topical PRN Hollice Espy, MD      . metoprolol succinate (TOPROL-XL) 24 hr tablet 100 mg  100 mg Oral Daily Hollice Espy, MD   100 mg at 09/28/20 1023  . mirtazapine (REMERON) tablet 30 mg  30 mg Oral QHS Mohid Furuya, Madie Reno, MD   30 mg at 09/27/20 2019  . morphine 4 MG/ML injection 4 mg  4 mg Intravenous Q4H PRN Hollice Espy, MD   4 mg at 09/28/20 1459  . ondansetron (ZOFRAN) injection 4 mg  4 mg Intravenous Q4H PRN Hollice Espy, MD   4 mg at 09/20/20 0108  . polyethylene glycol (MIRALAX / GLYCOLAX) packet 17 g  17 g Oral BID Val Riles, MD   17 g at 09/28/20 1023  . sodium chloride flush (NS) 0.9 % injection 3 mL  3 mL Intravenous Q12H Hollice Espy, MD   3 mL at 09/28/20 1023  . sodium chloride flush (NS) 0.9 % injection 3 mL  3 mL Intravenous PRN Hollice Espy, MD   3 mL at 09/21/20 2107  . sodium chloride flush (NS) 0.9 % injection 5 mL  5 mL Intracatheter Q8H Sandi Mariscal, MD   5 mL at 09/28/20 1500    Musculoskeletal: Strength & Muscle Tone: decreased Gait & Station: unsteady Patient leans: N/A  Psychiatric Specialty Exam:  Presentation  General Appearance: No data recorded Eye Contact:No data recorded Speech:No data recorded Speech Volume:No data recorded Handedness:No data recorded  Mood and Affect  Mood:No data recorded Affect:No data recorded  Thought Process  Thought Processes:No data recorded Descriptions of Associations:No data recorded Orientation:No data recorded Thought Content:No data recorded History of Schizophrenia/Schizoaffective disorder:No data recorded Duration of Psychotic Symptoms:No data recorded Hallucinations:No data recorded Ideas of Reference:No data recorded Suicidal Thoughts:No data recorded Homicidal Thoughts:No data recorded  Sensorium  Memory:No data recorded Judgment:No data recorded Insight:No data  recorded  Executive Functions  Concentration:No data recorded Attention Span:No data recorded Recall:No data recorded Fund of Knowledge:No data recorded Language:No data recorded  Psychomotor Activity  Psychomotor Activity:No data recorded  Assets  Assets:No data recorded  Sleep  Sleep:No data recorded  Physical Exam: Physical Exam Vitals and nursing note reviewed.  Constitutional:      Appearance: Normal appearance.  HENT:     Head: Normocephalic and atraumatic.     Mouth/Throat:     Pharynx: Oropharynx is clear.  Eyes:     Pupils: Pupils are equal, round, and reactive to light.  Cardiovascular:     Rate and Rhythm: Normal rate and regular rhythm.  Pulmonary:     Effort: Pulmonary effort is normal.     Breath sounds: Normal breath sounds.  Abdominal:     General: Abdomen is flat.     Palpations: Abdomen is soft.  Musculoskeletal:        General: Normal range of motion.  Skin:    General: Skin is warm and dry.  Neurological:     General: No focal deficit present.     Mental Status: She is alert. Mental status is at baseline.  Psychiatric:        Mood and Affect: Mood is depressed.        Speech: Speech is delayed.        Thought Content: Thought content includes suicidal ideation. Thought content does not include suicidal plan.    Review of Systems  Constitutional: Negative.   HENT: Negative.   Eyes: Negative.   Respiratory: Negative.   Cardiovascular: Negative.   Gastrointestinal: Negative.   Musculoskeletal: Negative.   Skin: Negative.   Neurological: Negative.   Psychiatric/Behavioral: Positive for depression and suicidal ideas.   Blood pressure 112/73, pulse 79, temperature 98 F (36.7 C), temperature source Oral, resp. rate 17, height 5' 9.5" (1.765 m), weight 76.7 kg, SpO2 95 %. Body mass index is 24.6 kg/m.  Treatment Plan Summary: Plan Patient is reporting feeling slightly better today.  Remeron dose was increased.  Appears to be tolerating  it okay.  Still endorsing suicidal ideation which would indicate a search for a geriatric psychiatry bed is still appropriate as a discharge option.  No change to overall plan.  Disposition: Recommend psychiatric Inpatient admission when medically cleared.  Alethia Berthold, MD 09/28/2020 6:35 PM

## 2020-09-28 NOTE — Progress Notes (Signed)
[facility] Progress Note  Patient: Gloria Gill    XVQ:[id number]  DOA: 09/19/2020     Date of Service: the patient was seen and examined on 09/28/2020  Chief Complaint  Patient presents with  . Abdominal Pain  . SI   Brief hospital course:  Merrissa Giacobbe [name] a 75 y.o.femalewith medical history significant foretoh (sober 3 years), a fib on eliquis, etoh cardiomyopathy, who presents with the above.  Few weeks of crampy suprapubic pain. Seen by pcp a week or so ago, clinical dx diverticulitis, started on augmentin. Didn't improved. Labs at f/u showed aki so referred to ED. Patient reports she was started on oxybutynin 3 weeks ago for urinary incontinence. No diarrhea or vomiting. No chest pain. No dysuria or fevers. Is making urine but thinks urinating less. No history kidney stones. Denies hematuria.  Told ED provider she's thinking of harming herself. Endorses SI and says plan is to drive into something with her car. 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 Denies alcohol or drug use.   Assessment and Plan: Principal Problem:   Severe recurrent major depression without psychotic features ([place]) Active Problems:   Alcohol abuse   Cardiomyopathy ([place])   AKI (acute kidney injury) ([place])   HFrEF (heart failure with reduced ejection fraction) ([place])   Hydronephrosis   Suicidal ideation   Somatic symptom disorder   Atrial fibrillation with rapid ventricular response ([place])   # AKI, resoled # Bilateral hydronephrosis # Hematuria # Likely bladder malignancy This appears to be a post-renal AKI. Cr 3.08 from normal previously.  Cr around 3, improved to 2.68--1.79 --1.2 Cystoscopy 3/7 showing likely malignancy. TURBT performed, path pending. R ureteral stent placed, unable to stent on the left. K and bicarb wnl. Hematuria overnight. h/h stable. Pain resolved. Urine culture neg.  - maintain foley - holding home oxybutynin, lasix,  losartan - monitor kidney function - hold eliquis, will need re-start when safe to do so - IR consulted for left nephrostomy tube done on 3/9, tolerated procedure well - will f/u urology recs PCN bag shows hematuria,  Eliquis is on hold for now, resume Eliquis tomorrow a.m. if H&H remained stable WBC 19.8--13.2 trending down, most likely reactive monitor off antibiotics   # Urinary bladder malignancy, biopsy shows adenocarcinoma of GYN origin Follow oncologist for further recommendation   # Suicidal ideation - psych consulted, advised starting mirtazapine, have ordered Patient still has severe depression and suicidal ideations, psych recommended Geri psych placement   # Cardiomyopathy # Chronic hfref 2/2 alcohol. Per cardiology record most recent EF 40%. Appears compensated - hold lasix and losartan as above - cont home metoprolol  # Paroxysmal a fib # RVR rvr resolved with cardizem gtt, now off - hold eliquis as above  - cont home metop - cont cardizem oral which is new - cardiology has signed off, will need f/u with outpatient  # Constipation - cont miralax - started Dulcolax p.o. and suppository as needed S/p mineral oil enema, did not move bowels  3/11 use Fleet enema x1, mineral enema at the nighttime if no BM and soapsuds enema tomorrow morning if no BM  Body mass index is 24.68 kg/m.         Diet: NPO Will resume diet after procedure DVT Prophylaxis: SCD, pharmacological prophylaxis contraindicated due to Risk of bleeding, anticipated procedure  Eliquis is on hold  Advance goals of care discussion: Full code  Family Communication: family was not present at bedside,  at the time of interview.  The pt provided permission to discuss medical plan with the family. Opportunity was given to ask question and all questions were answered satisfactorily.   Disposition:  Pt is from home, admitted with bilateral hydronephrosis, found to have bladder cancer, right  ureteral stent was placed, pending left percutaneous nephrostomy tube, plan for 3/9,  which precludes a safe discharge. Discharge to home versus SNF depends on recovery, when cleared by urology.  Subjective: No overnight issues, patient stated pain is under control, denies any chest pain or shortness of breath. Patient was having constipation has not moved bowels for around 1 week, patient was given suppository and enema but did not move bowels.  We will give her enema again today. Patient still has severe depression and suicidal thoughts, not stable to be discharged home, patient will need Geri psych placement    Physical Exam: General:  Alert, NAD.  Appear in no distress, affect appropriate Eyes: PERRLA ENT: Oral Mucosa Clear, moist  Neck: no JVD,  Cardiovascular: S1 and S2 Present, no Murmur,  Respiratory: good respiratory effort, Bilateral Air entry equal and Decreased, no Crackles, no wheezes Abdomen: Bowel Sound present, Soft and no tenderness, s/p Left PCN tube attached Skin: no rashes Extremities: no Pedal edema, no calf tenderness Neurologic: without any new focal findings Gait not checked due to patient safety concerns  Vitals:   09/27/20 1948 09/28/20 0417 09/28/20 0739 09/28/20 1118  BP: 123/79 137/85 138/71 136/90  Pulse: 86 87 81 (!) 110  Resp: 18 14 16 16   Temp: 98.1 F (36.7 C) 97.6 F (36.4 C) 98 F (36.7 C) 98.7 F (37.1 C)  TempSrc: Oral Oral Oral Oral  SpO2: 96% 98% 96% 99%  Weight:      Height:        Intake/Output Summary (Last 24 hours) at 09/28/2020 1223 Last data filed at 09/28/2020 1041 Gross per 24 hour  Intake 608 ml  Output 2450 ml  Net -1842 ml   Filed Weights   09/24/20 1436 09/25/20 0404 09/26/20 1354  Weight: 76.7 kg 76.9 kg 76.7 kg    Data Reviewed: I have personally reviewed and interpreted daily labs, tele strips, imagings as discussed above. I reviewed all nursing notes, pharmacy notes, vitals, pertinent old records I have  discussed plan of care as described above with RN and patient/family.  CBC: Recent Labs  Lab 09/24/20 0718 09/25/20 0411 09/26/20 0457 09/27/20 0424 09/28/20 0357  WBC 9.0 6.5 19.8* 13.2* 11.7*  HGB 12.7 12.5 11.3* 13.4 13.7  HCT 38.8 39.6 36.2 42.1 42.3  MCV 94.4 97.5 98.6 96.8 96.1  PLT 346 320 324 299 283   Basic Metabolic Panel: Recent Labs  Lab 09/24/20 0718 09/25/20 0411 09/26/20 0457 09/27/20 0424 09/28/20 0357  NA 141 140 141 142 140  K 5.1 4.5 4.5 4.1 4.1  CL 107 108 112* 110 106  CO2 24 23 23 23 25   GLUCOSE 90 217* 128* 94 94  BUN 30* 28* 27* 23 18  CREATININE 3.16* 2.68* 1.79* 1.20* 1.07*  CALCIUM 9.1 9.0 8.4* 8.7* 8.8*  MG  --   --   --  1.9 1.7  PHOS  --   --   --  2.8 2.9    Studies: No results found.  Scheduled Meds: . Chlorhexidine Gluconate Cloth  6 each Topical Daily  . diltiazem  240 mg Oral Daily  . metoprolol succinate  100 mg Oral Daily  . mineral oil  1 enema  Rectal Once  . mirtazapine  30 mg Oral QHS  . polyethylene glycol  17 g Oral BID  . sodium chloride flush  3 mL Intravenous Q12H  . sodium chloride flush  5 mL Intracatheter Q8H   Continuous Infusions: . sodium chloride     PRN Meds: sodium chloride, bisacodyl, bisacodyl, lip balm, morphine injection, ondansetron (ZOFRAN) IV, sodium chloride flush  Time spent: 35 minutes  Author: Val Riles. MD [facility] 09/28/2020 12:23 PM  To reach On-call, see care teams to locate the attending and reach out to them via [email or web address]. If 7PM-7AM, please contact night-coverage If you still have difficulty reaching the attending provider, please page the [facility] (Director on Call) for [facility] on amion for assistance.

## 2020-09-28 NOTE — Consult Note (Signed)
Hematology/Oncology Consult note [facility] [phone] Fax:([phone]  Patient Care Team: [name], MD as PCP - General (Internal Medicine)   Name of the patient: Gloria Gill  [id number]  June 06, 1946    Reason for consult: New diagnosis of GYN malignancy   Referring physician-Dr. Val Riles  Date of visit: 09/28/2020    History of presenting illness- Patient is a 75 year old female with a past medical history significant for alcohol abuse but sober for 3 years, cardiomyopathy, A. fib on Eliquis who presented with symptoms of suprapubic pain.  She also had thoughts of harming herself.  She was noted to have AKI with an elevated serum creatinine of 3.08 on 09/19/2020.  She underwent CT abdomen and pelvis without contrast which showed bilateral hydronephrosis of unknown etiology with ureters dilated to the level of bladder.  Decompressed bladder she had left nephrostomy tube placement with improvement of renal functions.  She also underwent cystoscopy TURBT and right ureteral stent placement with biopsies.  Cystoscopy showed infiltrative nodular type mass involving trigone and left bladder neck about 2 cm with other areas in the bladder which were also suspicious for malignancy.    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  Tumor positive for CK7, CK20, PAX8 and focal positivity for ER.  CDX2 negative.  Oncology consulted for further management.  She has also been evaluated by psychiatry And plan is to refer her to geriatric psychiatry ward at discharge and she has not been currently is deemed safe to go home  Patient admits being depressed. She was the main caregiver for her husband who has multiple medical problems. No children. She has been feeling weaker over the last month or so. Was feeling more hopeless when abdominal pain was worsening. She has not had a BM in the last 1 week. Currently states morphine  has been helping with abdominal pain  ECOG PS- 2-3  Pain scale-    Review of systems- Review of Systems  Constitutional: Positive for malaise/fatigue. Negative for chills, fever and weight loss.  HENT: Negative for congestion, ear discharge and nosebleeds.   Eyes: Negative for blurred vision.  Respiratory: Negative for cough, hemoptysis, sputum production, shortness of breath and wheezing.   Cardiovascular: Negative for chest pain, palpitations, orthopnea and claudication.  Gastrointestinal: Positive for abdominal pain and constipation. Negative for blood in stool, diarrhea, heartburn, melena, nausea and vomiting.  Genitourinary: Negative for dysuria, flank pain, frequency, hematuria and urgency.  Musculoskeletal: Negative for back pain, joint pain and myalgias.  Skin: Negative for rash.  Neurological: Negative for dizziness, tingling, focal weakness, seizures, weakness and headaches.  Endo/Heme/Allergies: Does not bruise/bleed easily.  Psychiatric/Behavioral: Negative for depression and suicidal ideas. The patient does not have insomnia.     Allergies  Allergen Reactions  . Lisinopril Nausea Only    Patient Active Problem List   Diagnosis Date Noted  . Atrial fibrillation with rapid ventricular response ([place]) 09/20/2020  . AKI (acute kidney injury) ([place]) 09/19/2020  . HFrEF (heart failure with reduced ejection fraction) ([place]) 09/19/2020  . Hydronephrosis 09/19/2020  . Suicidal ideation 09/19/2020  . Severe recurrent major depression without psychotic features ([place]) 09/19/2020  . 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 Alcohol abuse 05/06/2018  . Cardiomyopathy ([place]) 05/06/2018  . Atrial fibrillation ([place]) 12/08/2017     Past Medical History:  Diagnosis Date  . Atrial fibrillation ([place])   . Hypertension      Past Surgical History:  Procedure Laterality Date  .  APPENDECTOMY    . BREAST SURGERY    . CYSTOGRAM  09/24/2020   Procedure: CYSTOGRAM;  Surgeon: Hollice Espy, MD;  Location: [facility] ORS;  Service: Urology;;  . Consuela Mimes W/ RETROGRADES Bilateral 09/24/2020   Procedure: CYSTOSCOPY WITH RETROGRADE PYELOGRAM;  Surgeon: Hollice Espy, MD;  Location: [facility] ORS;  Service: Urology;  Laterality: Bilateral;  . CYSTOSCOPY WITH BIOPSY N/A 09/24/2020   Procedure: CYSTOSCOPY WITH BIOPSY;  Surgeon: Hollice Espy, MD;  Location: [facility] ORS;  Service: Urology;  Laterality: N/A;  . CYSTOSCOPY WITH STENT PLACEMENT Bilateral 09/24/2020   Procedure: CYSTOSCOPY WITH RIGHT STENT PLACEMENT;  Surgeon: Hollice Espy, MD;  Location: [facility] ORS;  Service: Urology;  Laterality: Bilateral;  . IR NEPHROSTOMY PLACEMENT LEFT  09/26/2020  . REDUCTION MAMMAPLASTY Bilateral 1981   pt stated she had implants with these scars/had implants removed 2009  . TRANSURETHRAL RESECTION OF BLADDER TUMOR N/A 09/24/2020   Procedure: TRANSURETHRAL RESECTION OF BLADDER TUMOR (TURBT);  Surgeon: Hollice Espy, MD;  Location: [facility] ORS;  Service: Urology;  Laterality: N/A;    Social History   Socioeconomic History  . Marital status: Married    Spouse name: Not on file  . Number of children: Not on file  . Years of education: Not on file  . Highest education level: Not on file  Occupational History  . Not on file  Tobacco Use  . Smoking status: Never Smoker  . Smokeless tobacco: Never Used  Substance and Sexual Activity  . Alcohol use: Not Currently  . Drug use: Never  . Sexual activity: Not on file  Other Topics Concern  . Not on file  Social History Narrative  . Not on file   Social Determinants of Health   Financial Resource Strain: Not on file  Food Insecurity: Not on file  Transportation Needs: Not on file  Physical Activity: Not on file  Stress: Not on file  Social Connections: Not on file  Intimate Partner Violence: Not on file     Family History  Problem Relation Age of Onset  . Breast cancer Neg Hx      Current Facility-Administered Medications:  .  0.9 %  sodium  chloride infusion, 250 mL, Intravenous, PRN, Hollice Espy, MD .  bisacodyl (DULCOLAX) EC tablet 10 mg, 10 mg, Oral, Daily PRN, Val Riles, MD .  bisacodyl (DULCOLAX) suppository 10 mg, 10 mg, Rectal, Daily PRN, Val Riles, MD, 10 mg at 09/27/20 0906 .  Chlorhexidine Gluconate Cloth 2 % PADS 6 each, 6 each, Topical, Daily, Hollice Espy, MD, 6 each at 09/28/20 1023 .  diltiazem (CARDIZEM CD) 24 hr capsule 240 mg, 240 mg, Oral, Daily, Hollice Espy, MD, 240 mg at 09/28/20 1023 .  lip balm (BLISTEX) ointment, , Topical, PRN, Hollice Espy, MD .  metoprolol succinate (TOPROL-XL) 24 hr tablet 100 mg, 100 mg, Oral, Daily, Hollice Espy, MD, 100 mg at 09/28/20 1023 .  mineral oil enema 1 enema, 1 enema, Rectal, Once, Val Riles, MD .  mirtazapine (REMERON) tablet 30 mg, 30 mg, Oral, QHS, Clapacs, John T, MD, 30 mg at 09/27/20 2019 .  morphine 4 MG/ML injection 4 mg, 4 mg, Intravenous, Q4H PRN, Hollice Espy, MD, 4 mg at 09/28/20 1459 .  ondansetron (ZOFRAN) injection 4 mg, 4 mg, Intravenous, Q4H PRN, Hollice Espy, MD, 4 mg at 09/20/20 0108 .  polyethylene glycol (MIRALAX / GLYCOLAX) packet 17 g, 17 g, Oral, BID, Val Riles, MD, 17 g at 09/28/20 1023 .  sodium chloride flush (NS) 0.9 %  injection 3 mL, 3 mL, Intravenous, Q12H, Hollice Espy, MD, 3 mL at 09/28/20 1023 .  sodium chloride flush (NS) 0.9 % injection 3 mL, 3 mL, Intravenous, PRN, Hollice Espy, MD, 3 mL at 09/21/20 2107 .  sodium chloride flush (NS) 0.9 % injection 5 mL, 5 mL, Intracatheter, Q8H, Sandi Mariscal, MD, 5 mL at 09/28/20 1500   Physical exam:  Vitals:   09/28/20 0417 09/28/20 0739 09/28/20 1118 09/28/20 1514  BP: 137/85 138/71 136/90 112/73  Pulse: 87 81 (!) 110 79  Resp: 14 16 16 17   Temp: 97.6 F (36.4 C) 98 F (36.7 C) 98.7 F (37.1 C) 98 F (36.7 C)  TempSrc: Oral Oral Oral Oral  SpO2: 98% 96% 99% 95%  Weight:      Height:       Physical Exam Constitutional:      Comments: Appears  fatigued  Cardiovascular:     Rate and Rhythm: Normal rate and regular rhythm.     Heart sounds: Normal heart sounds.  Pulmonary:     Effort: Pulmonary effort is normal.     Breath sounds: Normal breath sounds.  Abdominal:     General: Bowel sounds are normal. There is no distension.     Palpations: Abdomen is soft.     Tenderness: There is no abdominal tenderness.  Musculoskeletal:     Cervical back: Normal range of motion.  Skin:    General: Skin is warm and dry.  Neurological:     Mental Status: She is alert and oriented to person, place, and time.        CMP Latest Ref Rng & Units 09/28/2020  Glucose 70 - 99 mg/dL 94  BUN 8 - 23 mg/dL 18  Creatinine 0.44 - 1.00 mg/dL 1.07(H)  Sodium 135 - 145 mmol/L 140  Potassium 3.5 - 5.1 mmol/L 4.1  Chloride 98 - 111 mmol/L 106  CO2 22 - 32 mmol/L 25  Calcium 8.9 - 10.3 mg/dL 8.8(L)  Total Protein 6.5 - 8.1 g/dL -  Total Bilirubin 0.3 - 1.2 mg/dL -  Alkaline Phos 38 - 126 U/L -  AST 15 - 41 U/L -  ALT 0 - 44 U/L -   CBC Latest Ref Rng & Units 09/28/2020  WBC 4.0 - 10.5 K/uL 11.7(H)  Hemoglobin 12.0 - 15.0 g/dL 13.7  Hematocrit 36.0 - 46.0 % 42.3  Platelets 150 - 400 K/uL 295    @IMAGES @  CT ABDOMEN PELVIS WO CONTRAST  Result Date: 09/19/2020 CLINICAL DATA:  Left lower quadrant pain. EXAM: CT ABDOMEN AND PELVIS WITHOUT CONTRAST TECHNIQUE: Multidetector CT imaging of the abdomen and pelvis was performed following the standard protocol without IV contrast. COMPARISON:  03/20/2005 FINDINGS: Lower chest: Small right pleural effusion.  No confluent opacities. Hepatobiliary: No focal hepatic abnormality. Gallbladder unremarkable. Pancreas: No focal abnormality or ductal dilatation. Spleen: No focal abnormality.  Normal size. Adrenals/Urinary Tract: Bilateral hydronephrosis. No renal or ureteral stones. The ureters are dilated to the urinary bladder which is decompressed. Adrenal glands unremarkable. Stomach/Bowel: Colonic diverticulosis  diffusely. No active diverticulitis. Stomach and small bowel decompressed, unremarkable. Vascular/Lymphatic: Aortic atherosclerosis. No evidence of aneurysm or adenopathy. Reproductive: Uterus and adnexa unremarkable.  No mass. Other: Free fluid noted in the pelvis anterior to the uterus. No free air. Musculoskeletal: No acute bony abnormality. IMPRESSION: Diffuse colonic diverticulosis.  No active diverticulitis. Bilateral hydronephrosis of unknown etiology. Ureters are dilated to the bladder. No visible stones. Bladder is decompressed. Small amount of free fluid in the  pelvis. Aortic atherosclerosis. Small right pleural effusion. Electronically Signed   By: Rolm Baptise M.D.   On: 09/19/2020 10:44   DG Abd 1 View  Result Date: 09/28/2020 CLINICAL DATA:  Pain and constipation EXAM: ABDOMEN - 1 VIEW COMPARISON:  CT abdomen and pelvis September 19, 2020 FINDINGS: There is moderate stool throughout the colon. There is no bowel dilatation or air-fluid level to suggest bowel obstruction. No free air. Lung bases clear. Double-J stent on the right. Drain in left lateral abdomen. Small phleboliths noted in the pelvis. IMPRESSION: Moderate stool in colon.  No bowel obstruction or free air evident. Electronically Signed   By: Lowella Grip III M.D.   On: 09/28/2020 15:56   [place] Intraoperative  Result Date: 09/26/2020 CLINICAL DATA:  Ultrasound was provided for use by the ordering physician.  No provider Interpretation or professional fees incurred.    DG OR UROLOGY CYSTO IMAGE ([facility] ONLY)  Result Date: 09/24/2020 There is no interpretation for this exam.  This order is for images obtained during a surgical procedure.  Please See "Surgeries" Tab for more information regarding the procedure.   IR NEPHROSTOMY PLACEMENT LEFT  Result Date: 09/26/2020 INDICATION: History of bladder mass, post failed attempt at left-sided ureteral stent placement (note, a right-sided ureteral stent was able to be placed). As such, the  patient presents now for image guided placement of a left-sided nephrostomy catheter for urinary diversion purposes. EXAM: 1. ULTRASOUND GUIDANCE FOR PUNCTURE OF THE LEFT RENAL COLLECTING SYSTEM 2. LEFT PERCUTANEOUS NEPHROSTOMY TUBE PLACEMENT. COMPARISON:  CT abdomen and pelvis-09/19/2020 MEDICATIONS: The patient is currently admitted to the hospital receiving intravenous antibiotics; The antibiotic was administered in an appropriate time frame prior to skin puncture. ANESTHESIA/SEDATION: Moderate (conscious) sedation was employed during this procedure. A total of Versed 1 mg and Fentanyl 50 mcg was administered intravenously. Moderate Sedation Time: 10 minutes. The patient's level of consciousness and vital signs were monitored continuously by radiology nursing throughout the procedure under my direct supervision. CONTRAST:  10 cc Isovue 300 - administered into the renal collecting system FLUOROSCOPY TIME:  1 minute, 6 seconds (96.2 mGy) COMPLICATIONS: None immediate. PROCEDURE: The procedure, risks, benefits, and alternatives were explained to the patient. Questions regarding the procedure were encouraged and answered. The patient understands and consents to the procedure. A timeout was performed prior to the initiation of the procedure. The left flank region was prepped and draped in the usual sterile fashion and a sterile drape was applied covering the operative field. A sterile gown and sterile gloves were used for the procedure. Local anesthesia was provided with 1% Lidocaine with epinephrine. Ultrasound was used to localize the left kidney. Under direct ultrasound guidance, a 20 gauge needle was advanced into the renal collecting system. An ultrasound image documentation was performed. Access within the collecting system was confirmed with the efflux of urine followed by limited contrast injection. Over a Nitrex wire, the tract was dilated with an Accustick stent. Next, under intermittent fluoroscopic  guidance and over a short Amplatz wire, the track was dilated ultimately allowing placement of a 10-French percutaneous nephrostomy catheter which was advanced to the level of the renal pelvis where the coil was formed and locked. Contrast was injected and several spot fluoroscopic images were obtained in various obliquities. The catheter was secured at the skin with a Prolene retention suture and stat lock device and connected to a gravity bag was placed. Dressings were applied. The patient tolerated procedure well without immediate postprocedural complication. FINDINGS: Ultrasound  scanning demonstrates a mild to moderately dilated left renal collecting system, similar to abdominal CT performed 09/19/2020. Under a combination of ultrasound and fluoroscopic guidance, a posterior inferior calix was targeted allowing placement of a 10-French percutaneous nephrostomy catheter with end coiled and locked within the renal pelvis. Contrast injection confirmed appropriate positioning. IMPRESSION: Successful ultrasound and fluoroscopic guided placement of a left sided 10 French PCN. PLAN: - Maintain left-sided nephrostomy catheter to gravity bag. - The patient may return to interventional radiology for attempted antegrade placement of a ureteral stent in 3-4 weeks as deemed appropriate by referring urologist, Dr. Erlene Quan. Electronically Signed   By: Sandi Mariscal M.D.   On: 09/26/2020 15:26    Assessment and plan- Patient is a 75 y.o. female presenting with abdominal pain and AKI found to have bilateral hydronephrosis s/p left nephrostomy tube placement as well as cystoscopy and right ureteral stent placement.  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  Depression- psychairty on board. She is on mirtazapine. Patient feels that partly her depression is secondary to her physical debility, abdominal pain and constipation  Abdominal pain- possibly due to malignancy. On morphine 4 mg IV Q4 prn. Would be reasonable  to start long acting pain medication such as morphine IR 15 mg BID now that renal functions are better  Constipation- consider adding senna to miralax. If still no good BM, consider adding lactulose  Patient will need outpatient PET scan to complete her staging work-up since CT abdomen did not reveal any convincing adnexal mass but pathology shows GYN malignancy.  Will order Ca1 25 levels at baseline.  When she is stable from psychiatric standpoint I will arrange the PET scan to be done and also get her seen by GYN oncology as an outpatient to discuss further management.  Presently her performance status is poor and it remains to be seen if she can improve with physical therapy and better symptom control. Depending on discharge plans to inpatient psychiatry ward we will need to decide outpatient plans. I will also have palliative care see her on Monday.    Visit Diagnosis 1. Acute kidney injury ([place])   2. Hydronephrosis, unspecified hydronephrosis type   3. Severe recurrent major depression without psychotic features ([place])   4. Hydronephrosis of left kidney   5. Abdominal pain     Dr. Randa Evens, MD, MPH [facility] [phone] 09/28/2020  9:37 PM

## 2020-09-28 NOTE — TOC Initial Note (Addendum)
Transition of Care [facility]) - Initial/Assessment Note    Patient Details  Name: Gloria Gill MRN: [id number] Date of Birth: [birth year]  Transition of [facility] CM/SW Contact:    Beverly Sessions, RN Phone Number: 09/28/2020, 12:28 PM  Clinical Narrative:                 Psychiatry has reevaluated the patient and recommends geriatric psychiatry admission once medically cleared due to ongoing severe depression and suicidal ideation.  Patient in agreement with this plan  Patient states that she lives at home with her husband, and she "know he isn't going to be able to care for me after discharge" PCP Tressia Miners - denies issues with transportation  [place] denies issues obtaining medications  Patient is independent at baseline Patient has nephrostomy tube  - [facility])  [id number]  fax: [phone] - Per Vinnie Level Currently no beds available, referral faxed  - [facility] [place]) Fax: [phone] - referral faxed  - [facility] - no bed available  - [facility]   [place])              fax: [phone] - referral faxed - [facility]   ([place])       Fax: [phone] referral faxed - [facility] - fax: [phone]- referral faxed    Will continue to send out referrals   Expected Discharge Plan: [facility] Barriers to Discharge: Continued Medical Work up   Patient Goals and CMS Choice        Expected Discharge Plan and Services Expected Discharge Plan: [facility]   Discharge Planning Services: CM Consult   Living arrangements for the past 2 months: Single Family Home                                      Prior Living Arrangements/Services Living arrangements for the past 2 months: Single Family Home Lives with:: Spouse Patient language and need for interpreter reviewed:: Yes Do you feel safe going back to the place where you live?: No      Need for Family  Participation in Patient Care: Yes (Comment) Care giver support system in place?: Yes (comment)   Criminal Activity/Legal Involvement Pertinent to Current Situation/Hospitalization: No - Comment as needed  Activities of Daily Living Home Assistive Devices/Equipment: None ADL Screening (condition at time of admission) Patient's cognitive ability adequate to safely complete daily activities?: Yes Is the patient deaf or have difficulty hearing?: No Does the patient have difficulty seeing, even when wearing glasses/contacts?: No Does the patient have difficulty concentrating, remembering, or making decisions?: No Patient able to express need for assistance with ADLs?: Yes Does the patient have difficulty dressing or bathing?: No Independently performs ADLs?: No Does the patient have difficulty walking or climbing stairs?: No Weakness of Legs: None Weakness of Arms/Hands: None  Permission Sought/Granted                  Emotional Assessment Appearance:: Appears stated age     Orientation: : Oriented to Self,Oriented to  Time,Oriented to Situation,Oriented to Place   Psych Involvement: No (comment)  Admission diagnosis:  Atrial fibrillation with rapid ventricular response ([place]) [I48.91] AKI (acute kidney injury) ([place]) [N17.9] Acute kidney injury ([place]) [N17.9] Hydronephrosis, unspecified hydronephrosis type [N13.30] Patient Active Problem List   Diagnosis Date Noted   Atrial fibrillation with rapid ventricular response (  [place]) 09/20/2020   AKI (acute kidney injury) ([place]) 09/19/2020   HFrEF (heart failure with reduced ejection fraction) ([place]) 09/19/2020   Hydronephrosis 09/19/2020   Suicidal ideation 09/19/2020   Severe recurrent major depression without psychotic features ([place]) 09/19/2020   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

## 2020-09-29 DIAGNOSIS — F332 Major depressive disorder, recurrent severe without psychotic features: Secondary | ICD-10-CM | POA: Diagnosis not present

## 2020-09-29 LAB — BASIC METABOLIC PANEL WITH GFR
Anion gap: 8 (ref 5–15)
BUN: 16 mg/dL (ref 8–23)
CO2: 23 mmol/L (ref 22–32)
Calcium: 8.6 mg/dL — ABNORMAL LOW (ref 8.9–10.3)
Chloride: 103 mmol/L (ref 98–111)
Creatinine, Ser: 0.9 mg/dL (ref 0.44–1.00)
GFR, Estimated: >60 mL/min
Glucose, Bld: 98 mg/dL (ref 70–99)
Potassium: 3.8 mmol/L (ref 3.5–5.1)
Sodium: 134 mmol/L — ABNORMAL LOW (ref 135–145)

## 2020-09-29 LAB — CBC
HCT: 43.9 % (ref 36.0–46.0)
Hemoglobin: 14.4 g/dL (ref 12.0–15.0)
MCH: 31.2 pg (ref 26.0–34.0)
MCHC: 32.8 g/dL (ref 30.0–36.0)
MCV: 95 fL (ref 80.0–100.0)
Platelets: 297 K/uL (ref 150–400)
RBC: 4.62 MIL/uL (ref 3.87–5.11)
RDW: 12.5 % (ref 11.5–15.5)
WBC: 12.6 K/uL — ABNORMAL HIGH (ref 4.0–10.5)
nRBC: 0 % (ref 0.0–0.2)

## 2020-09-29 LAB — MAGNESIUM: Magnesium: 1.7 mg/dL (ref 1.7–2.4)

## 2020-09-29 LAB — PHOSPHORUS: Phosphorus: 3.3 mg/dL (ref 2.5–4.6)

## 2020-09-29 MED ORDER — MORPHINE SULFATE (PF) 2 MG/ML IV SOLN
2.0000 mg | INTRAVENOUS | Status: DC | PRN
  Administered 2020-09-29: 2 mg via INTRAVENOUS
  Filled 2020-09-29: qty 1

## 2020-09-29 MED ORDER — SENNA 8.6 MG PO TABS
1.0000 | ORAL_TABLET | Freq: Two times a day (BID) | ORAL | Status: DC
  Administered 2020-09-29 – 2020-10-01 (×6): 8.6 mg via ORAL
  Filled 2020-09-29 (×7): qty 1

## 2020-09-29 MED ORDER — APIXABAN 5 MG PO TABS
5.0000 mg | ORAL_TABLET | Freq: Two times a day (BID) | ORAL | Status: DC
  Administered 2020-09-29 – 2020-10-02 (×7): 5 mg via ORAL
  Filled 2020-09-29 (×7): qty 1

## 2020-09-29 MED ORDER — OXYCODONE HCL 5 MG PO TABS
5.0000 mg | ORAL_TABLET | Freq: Four times a day (QID) | ORAL | Status: DC | PRN
  Administered 2020-09-29 – 2020-10-01 (×2): 5 mg via ORAL
  Filled 2020-09-29 (×2): qty 1

## 2020-09-29 NOTE — Progress Notes (Signed)
Mobility Specialist - Progress Note   09/29/20 1500  Mobility  Activity Ambulated in hall  Level of Assistance Modified independent, requires aide device or extra time  Assistive Device Front wheel walker  Distance Ambulated (ft) 520 ft  Mobility Response Tolerated well  Mobility performed by Mobility specialist  $Mobility charge 1 Mobility    Pt ambulated in hallway with RW. No LOB noted. No dizziness reported. No SOB. HR WNL and O2 maintaining mid 90s throughout on room air.    Kathee Delton Mobility Specialist 09/29/20, 3:47 PM

## 2020-09-29 NOTE — Progress Notes (Signed)
PROGRESS NOTE    Gloria Gill  ZSW:[id number] DOB: 1945/07/29 DOA: 09/19/2020 PCP: [name], MD   Chief Complain: Abdominal pain  Brief Narrative: Patient is a 75 year old female with history of A. fib on Eliquis, alcohol induced cardiomyopathy, previous history of chronic alcohol abuse who presented with suprapubic pain.  Lab work done as an outpatient had shown AKI and his PCP referred her to the ED.  Work-up done here on admission showed AKI, bilateral hydronephrosis, hematuria.  Urology was consulted, underwent left-sided nephrostomy tube placement on 3/9.  Biopsy showed adenocarcinoma for GYN origin, oncology was consulted who recommended outpatient follow-up.  Patient had suicidal ideation, severe depression so psychiatry was consulted who recommended Geri psych placement.  Currently waiting for placement at psychiatric facility.  Patient is medically stable for transfer to inpatient psychiatric unit.  Assessment & Plan:   Principal Problem:   Severe recurrent major depression without psychotic features ([place]) Active Problems:   Alcohol abuse   Cardiomyopathy ([place])   AKI (acute kidney injury) ([place])   HFrEF (heart failure with reduced ejection fraction) ([facility])   Hydronephrosis   Suicidal ideation   Somatic symptom disorder   Atrial fibrillation with rapid ventricular response ([facility])   Abdominal pain: Resolved.  Continue supportive care, pain management  AKI/bilateral hydronephrosis/hematuria: Presented with creatinine in the range of 3.  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  She was also having lower abdominal pain on presentation.  Urology consulted and she underwent cystoscopy on 3/7, status post TURBT, status post right  ureteral stent placement and left nephrostomy tube.  Urology recommended outpatient follow-up. AKI has resolved.She needs periodic ephrostomy tube changes with IR.  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  Oncology recommended outpatient follow-up for further work-up like PET scan.  CT abdomen done here did not reveal any convincing adnexal mass  Severe depression/suicidal ideation: Psychiatry consulted and following.  On mirtazapine.  Recommended psychiatric admission.  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  Last EF of 40%.  On Lasix and losartan at home which are on hold due to elevated creatinine on presentation.  Continue metoprolol  Paroxysmal A. fib/A. fib with RVR: RVR resolved with Cardizem drip.  On Eliquis for anticoagulation, continue metoprolol.  Also started on oral Cardizem here.  Cardiology is following, signed off, recommending outpatient follow-up  Constipation: Continue aggressive bowel regimen.         DVT prophylaxis:Eliquis Code Status: Full Family Communication: None at bed side Status is: Inpatient  Remains inpatient appropriate because:Unsafe d/c plan   Dispo: The patient is from: Home              Anticipated d/c is to: Inpatient Psych              Patient currently is medically stable to d/c.   Difficult to place patient Yes    Consultants: Urology, psychiatric oncology  Procedures: As above  Antimicrobials:  Anti-infectives (From admission, onward)   Start     Dose/Rate Route Frequency Ordered Stop   09/24/20 1730  ceFAZolin (ANCEF) IVPB 2g/100 mL premix        2 g 200 mL/hr over 30 Minutes Intravenous  Once 09/24/20 1722 09/24/20 1804   09/24/20 1722  ceFAZolin (ANCEF) 2-4 GM/100ML-% IVPB       Note to Pharmacy: Register, Karen   : cabinet override      09/24/20 1722 09/24/20 1804  Subjective: Patient seen and examined at the bedside this morning.  Hemodynamically stable.  Still looks comfortable.  She says her mood is better today.  Denies any specific complaints  Objective: Vitals:   09/28/20 2123 09/28/20 2342 09/29/20 0548  09/29/20 0730  BP: 112/87 124/86 119/84 107/69  Pulse: 86 93 87 100  Resp: 16 18 16 16   Temp: 97.8 F (36.6 C) 98.2 F (36.8 C) 98.1 F (36.7 C) 98.1 F (36.7 C)  TempSrc:    Oral  SpO2: 96% 96% 95% 96%  Weight:      Height:        Intake/Output Summary (Last 24 hours) at 09/29/2020 0851 Last data filed at 09/29/2020 0641 Gross per 24 hour  Intake 245 ml  Output 1125 ml  Net -880 ml   Filed Weights   09/24/20 1436 09/25/20 0404 09/26/20 1354  Weight: 76.7 kg 76.9 kg 76.7 kg    Examination:  General exam: Appears calm and comfortable ,Not in distress,average built HEENT:PERRL,Oral mucosa moist, Ear/Nose normal on gross exam Respiratory system: Bilateral equal air entry, normal vesicular breath sounds, no wheezes or crackles  Cardiovascular system: S1 & S2 heard, RRR. No JVD, murmurs, rubs, gallops or clicks. No pedal edema. Gastrointestinal system: Abdomen is nondistended, soft and nontender. No organomegaly or masses felt. Normal bowel sounds heard. Central nervous system: Alert and oriented. No focal neurological deficits. Extremities: No edema, no clubbing ,no cyanosis Skin: No rashes, lesions or ulcers,no icterus ,no pallor   Data Reviewed: I have personally reviewed following labs and imaging studies  CBC: Recent Labs  Lab 09/25/20 0411 09/26/20 0457 09/27/20 0424 09/28/20 0357 09/29/20 0535  WBC 6.5 19.8* 13.2* 11.7* 12.6*  HGB 12.5 11.3* 13.4 13.7 14.4  HCT 39.6 36.2 42.1 42.3 43.9  MCV 97.5 98.6 96.8 96.1 95.0  PLT 320 324 299 295 856   Basic Metabolic Panel: Recent Labs  Lab 09/25/20 0411 09/26/20 0457 09/27/20 0424 09/28/20 0357 09/29/20 0535  NA 140 141 142 140 134*  K 4.5 4.5 4.1 4.1 3.8  CL 108 112* 110 106 103  CO2 23 23 23 25 23   GLUCOSE 217* 128* 94 94 98  BUN 28* 27* 23 18 16   CREATININE 2.68* 1.79* 1.20* 1.07* 0.90  CALCIUM 9.0 8.4* 8.7* 8.8* 8.6*  MG  --   --  1.9 1.7 1.7  PHOS  --   --  2.8 2.9 3.3   GFR: Estimated Creatinine  Clearance: 56.6 mL/min (by C-G formula based on SCr of 0.9 mg/dL). Liver Function Tests: No results for input(s): AST, ALT, ALKPHOS, BILITOT, PROT, ALBUMIN in the last 168 hours. No results for input(s): LIPASE, AMYLASE in the last 168 hours. No results for input(s): AMMONIA in the last 168 hours. Coagulation Profile: Recent Labs  Lab 09/25/20 0825 09/26/20 0457  INR 1.2 1.2   Cardiac Enzymes: No results for input(s): CKTOTAL, CKMB, CKMBINDEX, TROPONINI in the last 168 hours. BNP (last 3 results) No results for input(s): PROBNP in the last 8760 hours. HbA1C: No results for input(s): HGBA1C in the last 72 hours. CBG: No results for input(s): GLUCAP in the last 168 hours. Lipid Profile: No results for input(s): CHOL, HDL, LDLCALC, TRIG, CHOLHDL, LDLDIRECT in the last 72 hours. Thyroid Function Tests: No results for input(s): TSH, T4TOTAL, FREET4, T3FREE, THYROIDAB in the last 72 hours. Anemia Panel: No results for input(s): VITAMINB12, FOLATE, FERRITIN, TIBC, IRON, RETICCTPCT in the last 72 hours. Sepsis Labs: No results for input(s): PROCALCITON, LATICACIDVEN in  the last 168 hours.  Recent Results (from the past 240 hour(s))  SARS CORONAVIRUS 2 (TAT 6-24 HRS) Nasopharyngeal Nasopharyngeal Swab     Status: None   Collection Time: 09/19/20 12:08 PM   Specimen: Nasopharyngeal Swab  Result Value Ref Range Status   SARS Coronavirus 2 NEGATIVE NEGATIVE Final    Comment: (NOTE) SARS-CoV-2 target nucleic acids are NOT DETECTED.  The SARS-CoV-2 RNA is generally detectable in upper and lower respiratory specimens during the acute phase of infection. Negative results do not preclude SARS-CoV-2 infection, do not rule out co-infections with other pathogens, and should not be used as the sole basis for treatment or other patient management decisions. Negative results must be combined with clinical observations, patient history, and epidemiological information. The expected result is  Negative.  Fact Sheet for Patients: SugarRoll.be  Fact Sheet for Healthcare Providers: https://www.woods-mathews.com/  This test is not yet approved or cleared by the [place] FDA and  has been authorized for detection and/or diagnosis of SARS-CoV-2 by FDA under an Emergency Use Authorization (EUA). This EUA will remain  in effect (meaning this test can be used) for the duration of the COVID-19 declaration under Se ction 564(b)(1) of the Act, 21 U.S.C. section 360bbb-3(b)(1), unless the authorization is terminated or revoked sooner.  Performed at [facility], [place] [address], [place], [place] [address]   Urine Culture     Status: Abnormal   Collection Time: 09/19/20  4:33 PM   Specimen: Urine, Random  Result Value Ref Range Status   Specimen Description   Final    URINE, RANDOM Performed at [facility], [address] [place], [place] [address]    Special Requests   Final    NONE Performed at [facility], [address] [place], [place] [address]    Culture (A)  Final    <10,000 COLONIES/mL INSIGNIFICANT GROWTH Performed at [facility], [place] [address], [place], [place] [address]    Report Status 09/21/2020 FINAL  Final         Radiology Studies: DG Abd 1 View  Result Date: 09/28/2020 CLINICAL DATA:  Pain and constipation EXAM: ABDOMEN - 1 VIEW COMPARISON:  CT abdomen and pelvis September 19, 2020 FINDINGS: There is moderate stool throughout the colon. There is no bowel dilatation or air-fluid level to suggest bowel obstruction. No free air. Lung bases clear. Double-J stent on the right. Drain in left lateral abdomen. Small phleboliths noted in the pelvis. IMPRESSION: Moderate stool in colon.  No bowel obstruction or free air evident. Electronically Signed   By: Lowella Grip III M.D.   On: 09/28/2020 15:56        Scheduled Meds: . Chlorhexidine Gluconate Cloth  6 each Topical Daily   . diltiazem  240 mg Oral Daily  . metoprolol succinate  100 mg Oral Daily  . mirtazapine  30 mg Oral QHS  . polyethylene glycol  17 g Oral BID  . senna  1 tablet Oral BID  . sodium chloride flush  3 mL Intravenous Q12H  . sodium chloride flush  5 mL Intracatheter Q8H   Continuous Infusions: . sodium chloride       LOS: 10 days    Time spent: 35 mins.More than 50% of that time was spent in counseling and/or coordination of care.      Shelly Coss, MD [facility] P3/06/2021, 8:51 AM

## 2020-09-29 NOTE — Progress Notes (Signed)
Soap suds enema given as ordered, patient tolerated 761ml intake. No results at this time. Gloria Gill

## 2020-09-30 DIAGNOSIS — F332 Major depressive disorder, recurrent severe without psychotic features: Secondary | ICD-10-CM | POA: Diagnosis not present

## 2020-09-30 LAB — CBC
HCT: 40.3 % (ref 36.0–46.0)
Hemoglobin: 13.2 g/dL (ref 12.0–15.0)
MCH: 31.3 pg (ref 26.0–34.0)
MCHC: 32.8 g/dL (ref 30.0–36.0)
MCV: 95.5 fL (ref 80.0–100.0)
Platelets: 270 10*3/uL (ref 150–400)
RBC: 4.22 MIL/uL (ref 3.87–5.11)
RDW: 12.4 % (ref 11.5–15.5)
WBC: 12.7 10*3/uL — ABNORMAL HIGH (ref 4.0–10.5)
nRBC: 0 % (ref 0.0–0.2)

## 2020-09-30 MED ORDER — METOPROLOL SUCCINATE ER 25 MG PO TB24
25.0000 mg | ORAL_TABLET | Freq: Every day | ORAL | Status: DC
Start: 2020-10-01 — End: 2020-10-01

## 2020-09-30 MED ORDER — DILTIAZEM HCL ER COATED BEADS 120 MG PO CP24: 120.0000 mg | ORAL_CAPSULE | Freq: Every day | ORAL | Status: DC

## 2020-09-30 NOTE — Consult Note (Signed)
[facility] Face-to-Face Psychiatry Consult   Reason for Consult:  Psych reevaluation re need for inpatient psych admission Referring Physician:  Dr. Tawanna Solo Patient Identification: Gloria Gill MRN:  [id number] Principal Diagnosis: Severe recurrent major depression without psychotic features [facility]) Diagnosis:  Principal Problem:   Severe recurrent major depression without psychotic features ([place]) Active Problems:   Alcohol abuse   Cardiomyopathy ([place])   AKI (acute kidney injury) ([place])   HFrEF (heart failure with reduced ejection fraction) ([place])   Hydronephrosis   Suicidal ideation   Somatic symptom disorder   Atrial fibrillation with rapid ventricular response ([place])   Total Time spent with patient: 20 minutes  Subjective:   Gloria Gill is a 75 y.o. female patient admitted with AKI, bilateral hydronephrosis, hematuria. Psychiatry is following for depression, suicidal thoughts.  Psych reevaluation requested today to assess need for inpatient psych admission.  HPI:  Patient reports "feeling so much better", reports she is "much less depressed", "not suicidal", "I sleep good now". She reports mood improvement. She denies thoughts, plans, intents to harm self. She reports that "the medicine (Remeron) is working very well" and "I am surely want to continue taking it at home". She is home-sick. She reports she can contract for safety if discharged. She is missing her husband and her animals (a dog and five cats) and lists them as protective factors against suicide. Patient reports she is interested in seeing an outpatient mental health professional as well. Patient denies homicidal thoghts, denies any hallucinations. She reports good sleep and improved appetite. She denies side effects from psychotropic medication.    Past Psychiatric History: h/o depression  Risk to Self:   Risk to Others:   Prior Inpatient Therapy:   Prior Outpatient Therapy:    Past Medical History:   Past Medical History:  Diagnosis Date  . Atrial fibrillation ([place])   . Hypertension     Past Surgical History:  Procedure Laterality Date  . APPENDECTOMY    . BREAST SURGERY    . CYSTOGRAM  09/24/2020   Procedure: CYSTOGRAM;  Surgeon: Hollice Espy, MD;  Location: [facility] ORS;  Service: Urology;;  . Consuela Mimes W/ RETROGRADES Bilateral 09/24/2020   Procedure: CYSTOSCOPY WITH RETROGRADE PYELOGRAM;  Surgeon: Hollice Espy, MD;  Location: [facility] ORS;  Service: Urology;  Laterality: Bilateral;  . CYSTOSCOPY WITH BIOPSY N/A 09/24/2020   Procedure: CYSTOSCOPY WITH BIOPSY;  Surgeon: Hollice Espy, MD;  Location: [facility] ORS;  Service: Urology;  Laterality: N/A;  . CYSTOSCOPY WITH STENT PLACEMENT Bilateral 09/24/2020   Procedure: CYSTOSCOPY WITH RIGHT STENT PLACEMENT;  Surgeon: Hollice Espy, MD;  Location: [facility] ORS;  Service: Urology;  Laterality: Bilateral;  . IR NEPHROSTOMY PLACEMENT LEFT  09/26/2020  . REDUCTION MAMMAPLASTY Bilateral 1981   pt stated she had implants with these scars/had implants removed 2009  . TRANSURETHRAL RESECTION OF BLADDER TUMOR N/A 09/24/2020   Procedure: TRANSURETHRAL RESECTION OF BLADDER TUMOR (TURBT);  Surgeon: Hollice Espy, MD;  Location: [facility] ORS;  Service: Urology;  Laterality: N/A;   Family History:  Family History  Problem Relation Age of Onset  . Breast cancer Neg Hx    Family Psychiatric  History:  Social History:  Social History   Substance and Sexual Activity  Alcohol Use Not Currently     Social History   Substance and Sexual Activity  Drug Use Never    Social History   Socioeconomic History  . Marital status: Married    Spouse name: Not on file  . Number of children: Not  on file  . Years of education: Not on file  . Highest education level: Not on file  Occupational History  . Not on file  Tobacco Use  . Smoking status: Never Smoker  . Smokeless tobacco: Never Used  Substance and Sexual Activity  . Alcohol use: Not Currently  . Drug  use: Never  . Sexual activity: Not on file  Other Topics Concern  . Not on file  Social History Narrative  . Not on file   Social Determinants of Health   Financial Resource Strain: Not on file  Food Insecurity: Not on file  Transportation Needs: Not on file  Physical Activity: Not on file  Stress: Not on file  Social Connections: Not on file   Additional Social History:    Allergies:   Allergies  Allergen Reactions  . Lisinopril Nausea Only    Labs:  Results for orders placed or performed during the hospital encounter of 09/19/20 (from the past 48 hour(s))  Basic metabolic panel     Status: Abnormal   Collection Time: 09/29/20  5:35 AM  Result Value Ref Range   Sodium 134 (L) 135 - 145 mmol/L   Potassium 3.8 3.5 - 5.1 mmol/L   Chloride 103 98 - 111 mmol/L   CO2 23 22 - 32 mmol/L   Glucose, Bld 98 70 - 99 mg/dL    Comment: Glucose reference range applies only to samples taken after fasting for at least 8 hours.   BUN 16 8 - 23 mg/dL   Creatinine, Ser 0.90 0.44 - 1.00 mg/dL   Calcium 8.6 (L) 8.9 - 10.3 mg/dL   GFR, Estimated >60 >60 mL/min    Comment: (NOTE) Calculated using the CKD-EPI Creatinine Equation (2021)    Anion gap 8 5 - 15    Comment: Performed at [facility], [address] [place], [place] [address]  CBC     Status: Abnormal   Collection Time: 09/29/20  5:35 AM  Result Value Ref Range   WBC 12.6 (H) 4.0 - 10.5 K/uL   RBC 4.62 3.87 - 5.11 MIL/uL   Hemoglobin 14.4 12.0 - 15.0 g/dL   HCT 43.9 36.0 - 46.0 %   MCV 95.0 80.0 - 100.0 fL   MCH 31.2 26.0 - 34.0 pg   MCHC 32.8 30.0 - 36.0 g/dL   RDW 12.5 11.5 - 15.5 %   Platelets 297 150 - 400 K/uL   nRBC 0.0 0.0 - 0.2 %    Comment: Performed at [facility], [address] [place], [place] [address]  Magnesium     Status: None   Collection Time: 09/29/20  5:35 AM  Result Value Ref Range   Magnesium 1.7 1.7 - 2.4 mg/dL    Comment: Performed at [facility], [address] [place], [place] [address]  Phosphorus     Status: None   Collection Time: 09/29/20  5:35 AM  Result Value Ref Range   Phosphorus 3.3 2.5 - 4.6 mg/dL    Comment: Performed at [facility], [address] [place], [place] [address]  CBC     Status: Abnormal   Collection Time: 09/30/20  4:28 AM  Result Value Ref Range   WBC 12.7 (H) 4.0 - 10.5 K/uL   RBC 4.22 3.87 - 5.11 MIL/uL   Hemoglobin 13.2 12.0 - 15.0 g/dL   HCT 40.3 36.0 - 46.0 %   MCV 95.5 80.0 - 100.0 fL   MCH 31.3 26.0 - 34.0  pg   MCHC 32.8 30.0 - 36.0 g/dL   RDW 12.4 11.5 - 15.5 %   Platelets 270 150 - 400 K/uL   nRBC 0.0 0.0 - 0.2 %    Comment: Performed at [facility], [address] [place], [place] [address]    Current Facility-Administered Medications  Medication Dose Route Frequency Provider Last Rate Last Admin  . 0.9 %  sodium chloride infusion  250 mL Intravenous PRN Hollice Espy, MD      . apixaban Arne Cleveland) tablet 5 mg  5 mg Oral BID Shelly Coss, MD   5 mg at 09/30/20 1151  . bisacodyl (DULCOLAX) EC tablet 10 mg  10 mg Oral Daily PRN Val Riles, MD      . bisacodyl (DULCOLAX) suppository 10 mg  10 mg Rectal Daily PRN Val Riles, MD   10 mg at 09/27/20 0906  . Chlorhexidine Gluconate Cloth 2 % PADS 6 each  6 each Topical Daily Hollice Espy, MD   6 each at 09/30/20 1155  . diltiazem (CARDIZEM CD) 24 hr capsule 240 mg  240 mg Oral Daily Hollice Espy, MD   240 mg at 09/30/20 1151  . lip balm (BLISTEX) ointment   Topical PRN Hollice Espy, MD      . metoprolol succinate (TOPROL-XL) 24 hr tablet 100 mg  100 mg Oral Daily Hollice Espy, MD   100 mg at 09/30/20 1151  . mirtazapine (REMERON) tablet 30 mg  30 mg Oral QHS Clapacs, Madie Reno, MD   30 mg at 09/29/20 2202  . morphine 2 MG/ML injection 2 mg  2 mg Intravenous Q4H PRN Shelly Coss, MD   2 mg at 09/29/20 1355  . ondansetron (ZOFRAN) injection 4 mg  4 mg Intravenous Q4H PRN Hollice Espy, MD   4 mg at 09/20/20  0108  . oxyCODONE (Oxy IR/ROXICODONE) immediate release tablet 5 mg  5 mg Oral Q6H PRN Shelly Coss, MD   5 mg at 09/29/20 1656  . polyethylene glycol (MIRALAX / GLYCOLAX) packet 17 g  17 g Oral BID Val Riles, MD   17 g at 09/30/20 1151  . senna (SENOKOT) tablet 8.6 mg  1 tablet Oral BID Shelly Coss, MD   8.6 mg at 09/30/20 1151  . sodium chloride flush (NS) 0.9 % injection 3 mL  3 mL Intravenous Q12H Hollice Espy, MD   3 mL at 09/30/20 1203  . sodium chloride flush (NS) 0.9 % injection 3 mL  3 mL Intravenous PRN Hollice Espy, MD   3 mL at 09/21/20 2107  . sodium chloride flush (NS) 0.9 % injection 5 mL  5 mL Intracatheter Q8H Sandi Mariscal, MD   5 mL at 09/30/20 0527    Psychiatric Specialty Exam: MENTAL STATUS EXAM:  Appearance:  CF, appearing stated age, appears well-nourished;  wearing appropriate clothes, with fair grooming and hygiene. Normal level of alertness and appropriate facial expression.  Attitude/Behavior: calm, cooperative, engaging with appropriate eye contact.  Motor: WNL; dyskinesias not evident.   Speech: spontaneous, clear, coherent, normal comprehension.  Mood: euthymic, " good ".  Affect: appropriately-reactive.  Thought process: patient appears coherent, organized, logical, goal-directed, associations are appropriate.  Thought content: patient denies suicidal thoughts, denies homicidal thoughts; did not express any delusions.  Thought perception: patient denies auditory and visual hallucinations. Did not appear internally stimulated.  Cognition: patient is alert and oriented in self, place, date.  Insight: good, in regards of understanding of presence, nature, cause, and significance of mental or  emotional problem.  Judgement: good, in regards of ability to make good decisions concerning the appropriate thing to do in various situations, including ability to form opinions regarding their mental health condition.   Physical Exam: Physical  Exam ROS Blood pressure 98/66, pulse 89, temperature 98.5 F (36.9 C), temperature source Oral, resp. rate 20, height 5' 9.5" (1.765 m), weight 76.7 kg, SpO2 96 %. Body mass index is 24.6 kg/m.  Treatment Plan Summary:   75 y.o. female patient admitted with AKI, bilateral hydronephrosis, hematuria. Psychiatry is following for depression, suicidal thoughts. Psych reevaluation requested today to assess need for inpatient psych admission. During my assessment, pt is calm, cooperative, and logical. Patient denies suicidal, homicidal thoughts,  hallucinations and paranoia; patient does not appear to be psychotic, gravely disabled or at acute risk of harming self or others. Patient does not meet criteria for an inpatient psychiatric admission at this time.  Recommended to continue Mirtazapine (Remeron) 30mg  PO QHS for depression and sleep. Follow-up with outpatient mental health provider or PCP. Unit SW, please provide the patient with a list of outpatient mental health resources uo discharge.  Emergency Contact Plan: Patient understands to call 911, or go to the nearest ER as appropriate in case of thoughts of harm to self or others, or acute onset of intolerable side effects.    Disposition: No evidence of imminent risk to self or others at present.   Patient does not meet criteria for psychiatric inpatient admission. Supportive therapy provided about ongoing stressors. Discussed crisis plan, support from social network, calling 911, coming to the Emergency Department, and calling Suicide Hotline.  Larita Fife, MD 09/30/2020 12:57 PM

## 2020-09-30 NOTE — Progress Notes (Addendum)
PROGRESS NOTE    Gloria Gill  ZOX:[id number] DOB: Dec 15, 1945 DOA: 09/19/2020 PCP: [name], MD   Chief Complain: Abdominal pain  Brief Narrative: Patient is a 75 year old female with history of A. fib on Eliquis, alcohol induced cardiomyopathy, previous history of chronic alcohol abuse who presented with suprapubic pain.  Lab work done as an outpatient had shown AKI and his PCP referred her to the ED.  Work-up done here on admission showed AKI, bilateral hydronephrosis, hematuria.  Urology was consulted, underwent left-sided nephrostomy tube placement on 3/9.  Biopsy showed adenocarcinoma for GYN origin, oncology was consulted who recommended outpatient follow-up.  Patient had suicidal ideation, severe depression so psychiatry was consulted who recommended Geri psych placement.  Her mood has improved and psychiatry stays she does not require inpatient psychiatric admission.  Plan for discharge to home tomorrow( she has transportation issue today).  Assessment & Plan:   Principal Problem:   Severe recurrent major depression without psychotic features ([place]) Active Problems:   Alcohol abuse   Cardiomyopathy ([place])   AKI (acute kidney injury) ([place])   HFrEF (heart failure with reduced ejection fraction) (H[facility])   Hydronephrosis   Suicidal ideation   Somatic symptom disorder   Atrial fibrillation with rapid ventricular response (H[facility])   Abdominal pain: Resolved.  Continue supportive care, pain management  AKI/bilateral hydronephrosis/hematuria: Presented with creatinine in the range of 3.  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  She was also having lower abdominal pain on presentation.  Urology consulted and she underwent cystoscopy on 3/7, status post TURBT, status post right  ureteral stent placement and left nephrostomy tube.  Urology recommended outpatient follow-up. 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  Oncology recommended outpatient follow-up for further work-up like PET scan.  CT abdomen done here did not reveal any convincing adnexal mass.  She needs to follow-up with oncology as an outpatient  Severe depression/suicidal ideation: Psychiatry where consulted and following.  On mirtazapine.  Initially was recommended psychiatric admission, but patient wants to go home and her mood is better.  Psychiatry cleared for discharge.  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  Last EF of 40%.  On Lasix and losartan at home which are on hold due to elevated creatinine on presentation.  Continue metoprolol.  Paroxysmal A. fib/A. fib with RVR: RVR resolved with Cardizem drip.  On Eliquis for anticoagulation, continue metoprolol and cardizem at reduced dose.    Cardiology is following, signed off, recommending outpatient follow-up  Constipation: Continue aggressive bowel regimen.         DVT prophylaxis:Eliquis Code Status: Full Family Communication: Called husband on phone on 09/30/20,call not received Status is: Inpatient  Remains inpatient appropriate because:Unsafe d/c plan   Dispo: The patient is from: Home              Anticipated d/c is to: home              Patient currently is medically stable to d/c.   Difficult to place patient: No    Consultants: Urology, psychiatric oncology  Procedures: As above  Antimicrobials:  Anti-infectives (From admission, onward)   Start     Dose/Rate Route Frequency Ordered Stop   09/24/20 1730  ceFAZolin (ANCEF) IVPB 2g/100 mL premix  2 g 200 mL/hr over 30 Minutes Intravenous  Once 09/24/20 1722 09/24/20 1804   09/24/20 1722  ceFAZolin (ANCEF) 2-4 GM/100ML-% IVPB       Note to Pharmacy: Register, Santiago Glad   :  cabinet override      09/24/20 1722 09/24/20 1804      Subjective: Patient seen and examined the bedside this morning.  Hemodynamically stable.  Comfortable.  She states she feels better today, her mood is better.  She is missing her dog and wants to go home.  Patient requested for staying 1 more night because of transportation issues  Objective: Vitals:   09/30/20 0100 09/30/20 0503 09/30/20 0819 09/30/20 1227  BP: 113/65 103/64 120/68 98/66  Pulse: 76 97 80 89  Resp: 18 20 17 20   Temp:  97.7 F (36.5 C) (!) 97.3 F (36.3 C) 98.5 F (36.9 C)  TempSrc:   Oral Oral  SpO2: 96% 95% 91% 96%  Weight:      Height:        Intake/Output Summary (Last 24 hours) at 09/30/2020 1311 Last data filed at 09/30/2020 0900 Gross per 24 hour  Intake 485 ml  Output 400 ml  Net 85 ml   Filed Weights   09/24/20 1436 09/25/20 0404 09/26/20 1354  Weight: 76.7 kg 76.9 kg 76.7 kg    Examination:  General exam: Overall comfortable, not in distress,obese HEENT: PERRL Respiratory system:  no wheezes or crackles  Cardiovascular system: S1 & S2 heard, RRR.  Gastrointestinal system: Abdomen is nondistended, soft and nontender.  Left-sided nephrostomy tube Central nervous system: Alert and oriented Extremities: No edema, no clubbing ,no cyanosis Skin: No rashes, no ulcers,no icterus    Data Reviewed: I have personally reviewed following labs and imaging studies  CBC: Recent Labs  Lab 09/26/20 0457 09/27/20 0424 09/28/20 0357 09/29/20 0535 09/30/20 0428  WBC 19.8* 13.2* 11.7* 12.6* 12.7*  HGB 11.3* 13.4 13.7 14.4 13.2  HCT 36.2 42.1 42.3 43.9 40.3  MCV 98.6 96.8 96.1 95.0 95.5  PLT 324 299 295 297 748   Basic Metabolic Panel: Recent Labs  Lab 09/25/20 0411 09/26/20 0457 09/27/20 0424 09/28/20 0357 09/29/20 0535  NA 140 141 142 140 134*  K 4.5 4.5 4.1 4.1 3.8  CL 108 112* 110 106 103  CO2 23 23 23 25 23   GLUCOSE 217* 128* 94 94 98  BUN 28* 27* 23 18 16   CREATININE 2.68* 1.79*  1.20* 1.07* 0.90  CALCIUM 9.0 8.4* 8.7* 8.8* 8.6*  MG  --   --  1.9 1.7 1.7  PHOS  --   --  2.8 2.9 3.3   GFR: Estimated Creatinine Clearance: 56.6 mL/min (by C-G formula based on SCr of 0.9 mg/dL). Liver Function Tests: No results for input(s): AST, ALT, ALKPHOS, BILITOT, PROT, ALBUMIN in the last 168 hours. No results for input(s): LIPASE, AMYLASE in the last 168 hours. No results for input(s): AMMONIA in the last 168 hours. Coagulation Profile: Recent Labs  Lab 09/25/20 0825 09/26/20 0457  INR 1.2 1.2   Cardiac Enzymes: No results for input(s): CKTOTAL, CKMB, CKMBINDEX, TROPONINI in the last 168 hours. BNP (last 3 results) No results for input(s): PROBNP in the last 8760 hours. HbA1C: No results for input(s): HGBA1C in the last 72 hours. CBG: No results for input(s): GLUCAP in the last 168 hours. Lipid Profile: No results for input(s): CHOL, HDL, LDLCALC, TRIG, CHOLHDL, LDLDIRECT in the last 72 hours. Thyroid Function Tests: No results for input(s): TSH, T4TOTAL,  FREET4, T3FREE, THYROIDAB in the last 72 hours. Anemia Panel: No results for input(s): VITAMINB12, FOLATE, FERRITIN, TIBC, IRON, RETICCTPCT in the last 72 hours. Sepsis Labs: No results for input(s): PROCALCITON, LATICACIDVEN in the last 168 hours.  No results found for this or any previous visit (from the past 240 hour(s)).       Radiology Studies: DG Abd 1 View  Result Date: 09/28/2020 CLINICAL DATA:  Pain and constipation EXAM: ABDOMEN - 1 VIEW COMPARISON:  CT abdomen and pelvis September 19, 2020 FINDINGS: There is moderate stool throughout the colon. There is no bowel dilatation or air-fluid level to suggest bowel obstruction. No free air. Lung bases clear. Double-J stent on the right. Drain in left lateral abdomen. Small phleboliths noted in the pelvis. IMPRESSION: Moderate stool in colon.  No bowel obstruction or free air evident. Electronically Signed   By: Lowella Grip III M.D.   On: 09/28/2020 15:56         Scheduled Meds: . apixaban  5 mg Oral BID  . Chlorhexidine Gluconate Cloth  6 each Topical Daily  . diltiazem  240 mg Oral Daily  . [START ON 10/01/2020] metoprolol succinate  25 mg Oral Daily  . mirtazapine  30 mg Oral QHS  . polyethylene glycol  17 g Oral BID  . senna  1 tablet Oral BID  . sodium chloride flush  3 mL Intravenous Q12H  . sodium chloride flush  5 mL Intracatheter Q8H   Continuous Infusions: . sodium chloride       LOS: 11 days    Time spent: 35 mins.More than 50% of that time was spent in counseling and/or coordination of care.      Shelly Coss, MD [facility] P3/13/2022, 1:11 PM

## 2020-10-01 ENCOUNTER — Other Ambulatory Visit: Payer: Self-pay | Admitting: Nurse Practitioner

## 2020-10-01 ENCOUNTER — Other Ambulatory Visit: Payer: Self-pay | Admitting: Hospice and Palliative Medicine

## 2020-10-01 DIAGNOSIS — Z515 Encounter for palliative care: Secondary | ICD-10-CM | POA: Diagnosis not present

## 2020-10-01 DIAGNOSIS — F332 Major depressive disorder, recurrent severe without psychotic features: Secondary | ICD-10-CM | POA: Diagnosis not present

## 2020-10-01 DIAGNOSIS — C579 Malignant neoplasm of female genital organ, unspecified: Secondary | ICD-10-CM

## 2020-10-01 LAB — CBC
HCT: 36 % (ref 36.0–46.0)
Hemoglobin: 12.1 g/dL (ref 12.0–15.0)
MCH: 31.2 pg (ref 26.0–34.0)
MCHC: 33.6 g/dL (ref 30.0–36.0)
MCV: 92.8 fL (ref 80.0–100.0)
Platelets: 323 10*3/uL (ref 150–400)
RBC: 3.88 MIL/uL (ref 3.87–5.11)
RDW: 12.4 % (ref 11.5–15.5)
WBC: 10.7 10*3/uL — ABNORMAL HIGH (ref 4.0–10.5)
nRBC: 0 % (ref 0.0–0.2)

## 2020-10-01 MED ORDER — METOPROLOL SUCCINATE ER 50 MG PO TB24
50.0000 mg | ORAL_TABLET | Freq: Every day | ORAL | Status: DC
  Administered 2020-10-02: 50 mg via ORAL
  Filled 2020-10-01: qty 1

## 2020-10-01 MED ORDER — SENNA 8.6 MG PO TABS: 1.0000 | ORAL_TABLET | Freq: Two times a day (BID) | ORAL | 0 refills | Status: AC

## 2020-10-01 MED ORDER — MIRTAZAPINE 30 MG PO TABS: 30.0000 mg | ORAL_TABLET | Freq: Every day | ORAL | 0 refills | Status: AC

## 2020-10-01 MED ORDER — DILTIAZEM HCL 60 MG PO TABS
60.0000 mg | ORAL_TABLET | Freq: Three times a day (TID) | ORAL | Status: DC
  Administered 2020-10-01 – 2020-10-02 (×3): 60 mg via ORAL
  Filled 2020-10-01 (×4): qty 1

## 2020-10-01 MED ORDER — METOPROLOL SUCCINATE ER 25 MG PO TB24: 25.0000 mg | ORAL_TABLET | Freq: Every day | ORAL | Status: DC

## 2020-10-01 MED ORDER — METOPROLOL SUCCINATE ER 25 MG PO TB24: 12.5000 mg | ORAL_TABLET | Freq: Every day | ORAL | Status: DC

## 2020-10-01 MED ORDER — OXYCODONE HCL 5 MG PO TABS: 5.0000 mg | ORAL_TABLET | Freq: Four times a day (QID) | ORAL | 0 refills | Status: DC | PRN

## 2020-10-01 MED ORDER — METOPROLOL SUCCINATE ER 25 MG PO TB24: 25.0000 mg | ORAL_TABLET | Freq: Every day | ORAL | 1 refills | Status: DC

## 2020-10-01 MED ORDER — ALPRAZOLAM 0.5 MG PO TABS
0.5000 mg | ORAL_TABLET | Freq: Three times a day (TID) | ORAL | Status: DC | PRN
  Administered 2020-10-01: 0.5 mg via ORAL
  Filled 2020-10-01: qty 1

## 2020-10-01 MED ORDER — POLYETHYLENE GLYCOL 3350 17 G PO PACK: 17.0000 g | PACK | Freq: Every day | ORAL | 0 refills | Status: AC

## 2020-10-01 MED ORDER — DILTIAZEM HCL-DEXTROSE 125-5 MG/125ML-% IV SOLN (PREMIX)
5.0000 mg/h | INTRAVENOUS | Status: DC
  Filled 2020-10-01: qty 125

## 2020-10-01 NOTE — Care Management Important Message (Signed)
Important Message  Patient Details  Name: Gloria Gill MRN: [id number] Date of Birth: [birth year]   Medicare Important Message Given:  Yes     Dannette Barbara 10/01/2020, 11:30 AM
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
## 2020-10-01 NOTE — Evaluation (Signed)
Physical Therapy Evaluation Patient Details Name: Gloria Gill MRN: [id number] DOB: [birth year] Today's Date: 10/01/2020   History of Present Illness  Gloria Gill is a 75 y.o. female with medical history significant for etoh (sober 3 years), a fib on eliquis, etoh cardiomyopathy, who presents with abdominal pain. Few weeks of crampy suprapubic pain. Seen by pcp a week or so ago, clinical dx diverticulitis, started on augmentin. Didn't improved. Labs at f/u showed aki so referred to ED. Patient reports she was started on oxybutynin 3 weeks ago for urinary incontinence. No diarrhea or vomiting. No chest pain. No dysuria or fevers. Is making urine but thinks urinating less. No history kidney stones. Denies hematuria. 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  Clinical Impression  Patient received in bed resting. Wakes easily. Agrees to PT assessment. She is independent with bed mobility and transfers with supervision. She ambulated 400 feet with RW and supervision. Patient without LOB or significant difficulty, mild fatigue. She will benefit from continued ambulation with mobility specialist or nursing while here. Planning to discharge home today.          Follow Up Recommendations Home health PT    Equipment Recommendations  Rolling walker with 5" wheels    Recommendations for Other Services       Precautions / Restrictions Precautions Precaution Comments: mod fall Restrictions Weight Bearing Restrictions: No      Mobility  Bed Mobility Overal bed mobility: Independent                  Transfers Overall transfer level: Needs assistance Equipment used: Rolling walker (2 wheeled) Transfers: Sit to/from Stand Sit to Stand: Supervision            Ambulation/Gait Ambulation/Gait assistance: Supervision Gait Distance (Feet): 400 Feet Assistive device: Rolling walker (2 wheeled) Gait Pattern/deviations: WFL(Within Functional Limits) Gait velocity: WNL    General Gait Details: patient ambulates with steady gait, required RW to be adjusted to her height and she will benefit from having one at home.  Stairs            Wheelchair Mobility    Modified Rankin (Stroke Patients Only)       Balance Overall balance assessment: Modified Independent                                           Pertinent Vitals/Pain Pain Assessment: No/denies pain    Home Living Family/patient expects to be discharged to:: Private residence   Available Help at Discharge: Family;Available 24 hours/day Type of Home: House Home Access: Level entry     Home Layout: One level Home Equipment: None Additional Comments: husband uses cane and walker, so she needs her own.    Prior Function Level of Independence: Independent               Hand Dominance        Extremity/Trunk Assessment   Upper Extremity Assessment Upper Extremity Assessment: Generalized weakness    Lower Extremity Assessment Lower Extremity Assessment: Generalized weakness    Cervical / Trunk Assessment Cervical / Trunk Assessment: Normal  Communication   Communication: No difficulties  Cognition Arousal/Alertness: Awake/alert Behavior During Therapy: WFL for tasks assessed/performed Overall Cognitive Status: Within Functional Limits for tasks assessed  General Comments      Exercises     Assessment/Plan    PT Assessment Patent does not need any further PT services  PT Problem List Decreased strength;Decreased activity tolerance;Decreased mobility       PT Treatment Interventions Gait training    PT Goals (Current goals can be found in the Care Plan section)  Acute Rehab PT Goals Patient Stated Goal: to return home today PT Goal Formulation: With patient Time For Goal Achievement: 10/01/20 Potential to Achieve Goals: Good    Frequency     Barriers to discharge         Co-evaluation               AM-PAC PT "6 Clicks" Mobility  Outcome Measure Help needed turning from your back to your side while in a flat bed without using bedrails?: None Help needed moving from lying on your back to sitting on the side of a flat bed without using bedrails?: None Help needed moving to and from a bed to a chair (including a wheelchair)?: None Help needed standing up from a chair using your arms (e.g., wheelchair or bedside chair)?: None Help needed to walk in hospital room?: None Help needed climbing 3-5 steps with a railing? : None 6 Click Score: 24    End of Session   Activity Tolerance: Patient tolerated treatment well Patient left: in bed;with call bell/phone within reach Nurse Communication: Mobility status PT Visit Diagnosis: Muscle weakness (generalized) (M62.81)    Time: 9038-3338 PT Time Calculation (min) (ACUTE ONLY): 15 min   Charges:   PT Evaluation $PT Eval Low Complexity: 1 Low PT Treatments $Gait Training: 8-22 mins        Orvell Careaga, PT, GCS 10/01/20,9:57 AM

## 2020-10-01 NOTE — Significant Event (Signed)
Patient was about to be discharged but was found to be in A. fib with RVR.  We will hold the discharge today.  Will start on Cardizem drip.  We will discharge her tomorrow if her heart rate remains controlled.  Updated the husband on phone.

## 2020-10-01 NOTE — TOC Progression Note (Signed)
Transition of Care [facility]) - Progression Note    Patient Details  Name: DELLIA DONNELLY MRN: [id number] Date of Birth: Apr 02, 1946  Transition of Care [facility]) CM/SW Contact  Eileen Stanford, LCSW Phone Number: 10/01/2020, 10:41 AM  Clinical Narrative:   Gilford Rile ordered through [employer], will be delivered to the room.    [facility] Dis[facility]: [facility] Barriers to Discharge: Continued Medical Work up  Expected Discharge Plan and Services Expected Discharge Plan: [facility]   Discharge Planning Services: CM Consult   Living arrangements for the past 2 months: Single Family Home                                       Social Determinants of Health (SDOH) Interventions    Readmission Risk Interventions No flowsheet data found.

## 2020-10-01 NOTE — TOC Progression Note (Addendum)
Transition of Care [facility]) - Progression Note    Patient Details  Name: Gloria Gill MRN: [id number] Date of Birth: February 24, 1946  Transition of Care [facility]) CM/SW Contact  Eileen Stanford, LCSW Phone Number: 10/01/2020, 1:52 PM  Clinical Narrative:  [facility] will service pt. [facility] transport waiver signed and emailed. CSW had transport set up however had to cancel because per RN they are having trouble with pt's heart rate. Will reschedule when pt is medically ready for transport.     Expected Discharge Plan: [facility] Barriers to Discharge: Continued Medical Work up  Expected Discharge Plan and Services Expected Discharge Plan: [facility]   Discharge Planning Services: CM Consult   Living arrangements for the past 2 months: Single Family Home Expected Discharge Date: 10/01/20                                     Social Determinants of Health (SDOH) Interventions    Readmission Risk Interventions No flowsheet data found.

## 2020-10-01 NOTE — Progress Notes (Signed)
Dressing changed as per md's orders. Pt tol well

## 2020-10-01 NOTE — Progress Notes (Signed)
Referral for home-based palliative care

## 2020-10-01 NOTE — Progress Notes (Signed)
CCMD called and reported patient's heart rate went to 140's with Afib RVR that lasted 2 to 3 minutes, now currently Afib 102. Dr. Tawanna Solo notified . Orders to just continue to monitor at this time.Gloria Gill

## 2020-10-01 NOTE — Discharge Summary (Addendum)
Physician Discharge Summary  Gloria Gill VQM:[id number] DOB: 06-03-1946 DOA: 09/19/2020  PCP: [name], MD  Admit date: 09/19/2020 Discharge date: 10/02/2020  Admitted From: Home Disposition:  Home  Discharge Condition:Stable CODE STATUS:FULL Diet recommendation: Heart Healthy   Brief/Interim Summary: Patient is a 75 year old female with history of A. fib on Eliquis, alcohol induced cardiomyopathy, previous history of chronic alcohol abuse who presented with suprapubic pain.  Lab work done as an outpatient had shown AKI and his PCP referred her to the ED.  Work-up done here on admission showed AKI, bilateral hydronephrosis, hematuria.  Urology was consulted, underwent left-sided nephrostomy tube placement on 3/9.  Biopsy showed adenocarcinoma for GYN origin, oncology was consulted who recommended outpatient follow-up.  Patient had suicidal ideation, severe depression so psychiatry was consulted who recommended Geri psych placement.  Her mood has improved and psychiatry stays she does not require inpatient psychiatric admission.  Patient is medically stable for discharge to home today.  She should be followed by psychiatry, oncology, urology and IR as an outpatient.  Following problems were addressed during her hospitalization:  Abdominal pain: Resolved.  Thought to be secondary to malignancy  AKI/bilateral hydronephrosis/hematuria: Presented with creatinine in the range of 3.  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  She was also having lower abdominal pain on presentation.  Urology consulted and she underwent cystoscopy on 3/7, status post TURBT, status post right  ureteral stent placement and left nephrostomy tube.  Urology recommended outpatient follow-up. 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  Oncology recommended outpatient follow-up for further work-up like PET scan.  CT abdomen done here did not reveal any convincing adnexal mass.  She needs to follow-up with oncology as an outpatient  Severe depression/suicidal ideation: Psychiatry where consulted and following.  On mirtazapine.  Initially was recommended psychiatric admission, but patient wants to go home and her mood is better.  Psychiatry cleared for discharge.  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  Last EF of 40%.  On Lasix and losartan at home which are on hold due to elevated creatinine on presentation.  Continue metoprolol.  Paroxysmal A. fib/A. fib with RVR: RVR resolved with Cardizem drip.  On Eliquis for anticoagulation, continue metoprolol and cardizem. .   Cardiology was following, signed off, recommending outpatient follow-up  Constipation: Continue aggressive bowel regimen.   Discharge Diagnoses:  Principal Problem:   Severe recurrent major depression without psychotic features ([facility]) Active Problems:   Alcohol abuse   Cardiomyopathy ([place])   AKI (acute kidney injury) ([place])   HFrEF (heart failure with reduced ejection fraction) ([place])   Hydronephrosis   Suicidal ideation   Somatic symptom disorder   Atrial fibrillation with rapid ventricular response [facility])   Palliative care encounter    Discharge Instructions  Discharge Instructions    Diet - low sodium heart healthy   Complete by: As directed    Discharge instructions   Complete by: As directed    1)Please follow-up with your PCP in a week. 2)Follow up with oncology, urology, cardiology in 2 to 3 weeks.  Name and number of the providers have been attached 3)Follow up with psychiatry as an outpatient as soon as possible 4)Take prescribed medications as instructed.   Increase activity slowly   Complete by: As directed  No  wound care   Complete by: As directed      Allergies as of 10/02/2020      Reactions   Lisinopril Nausea Only      Medication List    STOP taking these medications   furosemide 20 MG tablet Commonly known as: LASIX   losartan 25 MG tablet Commonly known as: COZAAR   meloxicam 15 MG tablet Commonly known as: MOBIC   venlafaxine XR 37.5 MG 24 hr capsule Commonly known as: EFFEXOR-XR     TAKE these medications   diltiazem 180 MG 24 hr capsule Commonly known as: CARDIZEM CD Take 1 capsule (180 mg total) by mouth daily. Start taking on: October 03, 2020   Eliquis 5 MG Tabs tablet Generic drug: apixaban Take 5 mg by mouth 2 (two) times daily.   metoprolol succinate 50 MG 24 hr tablet Commonly known as: TOPROL-XL Take 1 tablet (50 mg total) by mouth daily. Take with or immediately following a meal. Start taking on: October 03, 2020 What changed:   medication strength  how much to take  additional instructions   mirtazapine 30 MG tablet Commonly known as: REMERON Take 1 tablet (30 mg total) by mouth at bedtime.   oxybutynin 10 MG 24 hr tablet Commonly known as: DITROPAN-XL Take 10 mg by mouth daily.   oxyCODONE 5 MG immediate release tablet Commonly known as: Oxy IR/ROXICODONE Take 1 tablet (5 mg total) by mouth every 6 (six) hours as needed for moderate pain.   polyethylene glycol 17 g packet Commonly known as: MIRALAX / GLYCOLAX Take 17 g by mouth daily.   senna 8.6 MG Tabs tablet Commonly known as: SENOKOT Take 1 tablet (8.6 mg total) by mouth 2 (two) times daily.   Vitamin D (Ergocalciferol) 1.25 MG (50000 UNIT) Caps capsule Commonly known as: DRISDOL Take 50,000 Units by mouth once a week.            Durable Medical Equipment  (From admission, onward)         Start     Ordered   10/01/20 1223  For home use only DME Walker  Once       Question:  Patient needs a walker to treat with the following condition  Answer:  Balance disorder   10/01/20  1222          Follow-up Information    [name], MD. Schedule an appointment as soon as possible for a visit in 1 week.   Specialty: Internal Medicine Contact information: [place] [address] [phone]        Hollice Espy, MD. Schedule an appointment as soon as possible for a visit in 2 weeks.   Specialty: Urology Contact information: [place] [phone]        Teodoro Spray, MD. Go on 10/15/2020.   Specialty: Cardiology Why: 2:30pm appointment Contact information: [place] [address] [phone]              Allergies  Allergen Reactions  . Lisinopril Nausea Only    Consultations:  Cardiology, urology, psychiatry, IR, oncology   Procedures/Studies: CT ABDOMEN PELVIS WO CONTRAST  Result Date: 09/19/2020 CLINICAL DATA:  Left lower quadrant pain. EXAM: CT ABDOMEN AND PELVIS WITHOUT CONTRAST TECHNIQUE: Multidetector CT imaging of the abdomen and pelvis was performed following the standard protocol without IV contrast. COMPARISON:  03/20/2005 FINDINGS: Lower chest: Small right pleural effusion.  No confluent  opacities. Hepatobiliary: No focal hepatic abnormality. Gallbladder unremarkable. Pancreas: No focal abnormality or ductal dilatation. Spleen: No focal abnormality.  Normal size. Adrenals/Urinary Tract: Bilateral hydronephrosis. No renal or ureteral stones. The ureters are dilated to the urinary bladder which is decompressed. Adrenal glands unremarkable. Stomach/Bowel: Colonic diverticulosis diffusely. No active diverticulitis. Stomach and small bowel decompressed, unremarkable. Vascular/Lymphatic: Aortic atherosclerosis. No evidence of aneurysm or adenopathy. Reproductive: Uterus and adnexa unremarkable.  No mass. Other: Free fluid noted in the pelvis anterior to the uterus. No free air. Musculoskeletal: No acute bony abnormality. IMPRESSION: Diffuse  colonic diverticulosis.  No active diverticulitis. Bilateral hydronephrosis of unknown etiology. Ureters are dilated to the bladder. No visible stones. Bladder is decompressed. Small amount of free fluid in the pelvis. Aortic atherosclerosis. Small right pleural effusion. Electronically Signed   By: Rolm Baptise M.D.   On: 09/19/2020 10:44   DG Abd 1 View  Result Date: 09/28/2020 CLINICAL DATA:  Pain and constipation EXAM: ABDOMEN - 1 VIEW COMPARISON:  CT abdomen and pelvis September 19, 2020 FINDINGS: There is moderate stool throughout the colon. There is no bowel dilatation or air-fluid level to suggest bowel obstruction. No free air. Lung bases clear. Double-J stent on the right. Drain in left lateral abdomen. Small phleboliths noted in the pelvis. IMPRESSION: Moderate stool in colon.  No bowel obstruction or free air evident. Electronically Signed   By: Lowella Grip III M.D.   On: 09/28/2020 15:56   [place] Intraoperative  Result Date: 09/26/2020 CLINICAL DATA:  Ultrasound was provided for use by the ordering physician.  No provider Interpretation or professional fees incurred.    DG OR UROLOGY CYSTO IMAGE ([facility] ONLY)  Result Date: 09/24/2020 There is no interpretation for this exam.  This order is for images obtained during a surgical procedure.  Please See "Surgeries" Tab for more information regarding the procedure.   IR NEPHROSTOMY PLACEMENT LEFT  Result Date: 09/26/2020 INDICATION: History of bladder mass, post failed attempt at left-sided ureteral stent placement (note, a right-sided ureteral stent was able to be placed). As such, the patient presents now for image guided placement of a left-sided nephrostomy catheter for urinary diversion purposes. EXAM: 1. ULTRASOUND GUIDANCE FOR PUNCTURE OF THE LEFT RENAL COLLECTING SYSTEM 2. LEFT PERCUTANEOUS NEPHROSTOMY TUBE PLACEMENT. COMPARISON:  CT abdomen and pelvis-09/19/2020 MEDICATIONS: The patient is currently admitted to the hospital receiving  intravenous antibiotics; The antibiotic was administered in an appropriate time frame prior to skin puncture. ANESTHESIA/SEDATION: Moderate (conscious) sedation was employed during this procedure. A total of Versed 1 mg and Fentanyl 50 mcg was administered intravenously. Moderate Sedation Time: 10 minutes. The patient's level of consciousness and vital signs were monitored continuously by radiology nursing throughout the procedure under my direct supervision. CONTRAST:  10 cc Isovue 300 - administered into the renal collecting system FLUOROSCOPY TIME:  1 minute, 6 seconds (89.3 mGy) COMPLICATIONS: None immediate. PROCEDURE: The procedure, risks, benefits, and alternatives were explained to the patient. Questions regarding the procedure were encouraged and answered. The patient understands and consents to the procedure. A timeout was performed prior to the initiation of the procedure. The left flank region was prepped and draped in the usual sterile fashion and a sterile drape was applied covering the operative field. A sterile gown and sterile gloves were used for the procedure. Local anesthesia was provided with 1% Lidocaine with epinephrine. Ultrasound was used to localize the left kidney. Under direct ultrasound guidance, a 20 gauge needle was advanced into the renal collecting system. An  ultrasound image documentation was performed. Access within the collecting system was confirmed with the efflux of urine followed by limited contrast injection. Over a Nitrex wire, the tract was dilated with an Accustick stent. Next, under intermittent fluoroscopic guidance and over a short Amplatz wire, the track was dilated ultimately allowing placement of a 10-French percutaneous nephrostomy catheter which was advanced to the level of the renal pelvis where the coil was formed and locked. Contrast was injected and several spot fluoroscopic images were obtained in various obliquities. The catheter was secured at the skin with  a Prolene retention suture and stat lock device and connected to a gravity bag was placed. Dressings were applied. The patient tolerated procedure well without immediate postprocedural complication. FINDINGS: Ultrasound scanning demonstrates a mild to moderately dilated left renal collecting system, similar to abdominal CT performed 09/19/2020. Under a combination of ultrasound and fluoroscopic guidance, a posterior inferior calix was targeted allowing placement of a 10-French percutaneous nephrostomy catheter with end coiled and locked within the renal pelvis. Contrast injection confirmed appropriate positioning. IMPRESSION: Successful ultrasound and fluoroscopic guided placement of a left sided 10 French PCN. PLAN: - Maintain left-sided nephrostomy catheter to gravity bag. - The patient may return to interventional radiology for attempted antegrade placement of a ureteral stent in 3-4 weeks as deemed appropriate by referring urologist, Dr. Erlene Quan. Electronically Signed   By: Sandi Mariscal M.D.   On: 09/26/2020 15:26      Subjective: Patient seen and examined at the bedside this morning.  Hemodynamically stable for discharge today.  Discharge Exam: Vitals:   10/02/20 0900 10/02/20 1032  BP: 103/69 104/70  Pulse: (!) 117 84  Resp: 18 18  Temp: 97.7 F (36.5 C) 97.8 F (36.6 C)  SpO2: 100% 94%   Vitals:   10/01/20 2308 10/02/20 0602 10/02/20 0900 10/02/20 1032  BP: 134/65 122/80 103/69 104/70  Pulse: (!) 103 89 (!) 117 84  Resp: 20 20 18 18   Temp: 98 F (36.7 C) (!) 97.5 F (36.4 C) 97.7 F (36.5 C) 97.8 F (36.6 C)  TempSrc: Oral Oral Oral Oral  SpO2: 97% 94% 100% 94%  Weight:      Height:        General: Pt is alert, awake, not in acute distress Cardiovascular: RRR, S1/S2 +, no rubs, no gallops Respiratory: CTA bilaterally, no wheezing, no rhonchi Abdominal: Soft, NT, ND, bowel sounds +, left-sided nephrostomy tube Extremities: no edema, no cyanosis    The results of  significant diagnostics from this hospitalization (including imaging, microbiology, ancillary and laboratory) are listed below for reference.     Microbiology: No results found for this or any previous visit (from the past 240 hour(s)).   Labs: BNP (last 3 results) No results for input(s): BNP in the last 8760 hours. Basic Metabolic Panel: Recent Labs  Lab 09/26/20 0457 09/27/20 0424 09/28/20 0357 09/29/20 0535  NA 141 142 140 134*  K 4.5 4.1 4.1 3.8  CL 112* 110 106 103  CO2 23 23 25 23   GLUCOSE 128* 94 94 98  BUN 27* 23 18 16   CREATININE 1.79* 1.20* 1.07* 0.90  CALCIUM 8.4* 8.7* 8.8* 8.6*  MG  --  1.9 1.7 1.7  PHOS  --  2.8 2.9 3.3   Liver Function Tests: No results for input(s): AST, ALT, ALKPHOS, BILITOT, PROT, ALBUMIN in the last 168 hours. No results for input(s): LIPASE, AMYLASE in the last 168 hours. No results for input(s): AMMONIA in the last 168 hours. CBC:  Recent Labs  Lab 09/27/20 0424 09/28/20 0357 09/29/20 0535 09/30/20 0428 10/01/20 0418  WBC 13.2* 11.7* 12.6* 12.7* 10.7*  HGB 13.4 13.7 14.4 13.2 12.1  HCT 42.1 42.3 43.9 40.3 36.0  MCV 96.8 96.1 95.0 95.5 92.8  PLT 299 295 297 270 323   Cardiac Enzymes: No results for input(s): CKTOTAL, CKMB, CKMBINDEX, TROPONINI in the last 168 hours. BNP: Invalid input(s): POCBNP CBG: No results for input(s): GLUCAP in the last 168 hours. D-Dimer No results for input(s): DDIMER in the last 72 hours. Hgb A1c No results for input(s): HGBA1C in the last 72 hours. Lipid Profile No results for input(s): CHOL, HDL, LDLCALC, TRIG, CHOLHDL, LDLDIRECT in the last 72 hours. Thyroid function studies No results for input(s): TSH, T4TOTAL, T3FREE, THYROIDAB in the last 72 hours.  Invalid input(s): FREET3 Anemia work up No results for input(s): VITAMINB12, FOLATE, FERRITIN, TIBC, IRON, RETICCTPCT in the last 72 hours. Urinalysis    Component Value Date/Time   COLORURINE YELLOW (A) 09/19/2020 1501   APPEARANCEUR  HAZY (A) 09/19/2020 1501   LABSPEC 1.010 09/19/2020 1501   PHURINE 5.0 09/19/2020 1501   GLUCOSEU NEGATIVE 09/19/2020 1501   HGBUR MODERATE (A) 09/19/2020 1501   BILIRUBINUR NEGATIVE 09/19/2020 1501   KETONESUR NEGATIVE 09/19/2020 1501   PROTEINUR 30 (A) 09/19/2020 1501   NITRITE NEGATIVE 09/19/2020 1501   LEUKOCYTESUR MODERATE (A) 09/19/2020 1501   Sepsis Labs Invalid input(s): PROCALCITONIN,  WBC,  LACTICIDVEN Microbiology No results found for this or any previous visit (from the past 240 hour(s)).  Please note: You were cared for by a hospitalist during your hospital stay. Once you are discharged, your primary care physician will handle any further medical issues. Please note that NO REFILLS for any discharge medications will be authorized once you are discharged, as it is imperative that you return to your primary care physician (or establish a relationship with a primary care physician if you do not have one) for your post hospital discharge needs so that they can reassess your need for medications and monitor your lab values.    Time coordinating discharge: 40 minutes  SIGNED:   Shelly Coss, MD  [facility] 10/02/2020, 10:49 AM Pager [phone]  If 7PM-7AM, please contact night-coverage [email or web address] Password [email or web address]

## 2020-10-01 NOTE — TOC Progression Note (Addendum)
Transition of Care [facility]) - Progression Note    Patient Details  Name: ALAYZIAH TANGEMAN MRN: [id number] Date of Birth: Apr 29, 1946  Transition of Care [facility]) CM/SW [place], RN Phone Number: 10/01/2020, 8:57 AM  Clinical Narrative:     Received VM from Ed,husband requesting callback.   LVMM for Ed returning call.   Spoke to ed, Spouse who confirmed he will be home all day but is unable to transport patient due to he has to wait at home for a plumber to come. Requested [facility] to assist with Nephrostomy tubes and possible PT/OT/Aide. Husband states he has to care for the home and the pets and would appreciate any assistance with her care. Will need [facility].   Spoke to [place] with [facility] who is checking on availability.    [facility] Discharge [facility]: [facility] Barriers to Discharge: Continued Medical Work up  Expected Discharge Plan and Services Expected Discharge Plan: [facility]   Discharge Planning Services: CM Consult   Living arrangements for the past 2 months: Single Family Home                                       Social Determinants of Health (SDOH) Interventions    Readmission Risk Interventions No flowsheet data found.

## 2020-10-02 ENCOUNTER — Telehealth: Payer: Self-pay | Admitting: Oncology

## 2020-10-02 ENCOUNTER — Other Ambulatory Visit: Payer: Self-pay | Admitting: *Deleted

## 2020-10-02 DIAGNOSIS — C579 Malignant neoplasm of female genital organ, unspecified: Secondary | ICD-10-CM

## 2020-10-02 LAB — CA 125: Cancer Antigen (CA) 125: 399 U/mL — ABNORMAL HIGH (ref 0.0–38.1)

## 2020-10-02 MED ORDER — DILTIAZEM HCL ER COATED BEADS 180 MG PO CP24
180.0000 mg | ORAL_CAPSULE | Freq: Every day | ORAL | 1 refills | Status: AC
Start: 2020-10-03 — End: ?

## 2020-10-02 MED ORDER — DILTIAZEM HCL ER COATED BEADS 180 MG PO CP24
180.0000 mg | ORAL_CAPSULE | Freq: Every day | ORAL | Status: DC
  Administered 2020-10-02: 180 mg via ORAL
  Filled 2020-10-02: qty 1

## 2020-10-02 MED ORDER — METOPROLOL SUCCINATE ER 100 MG PO TB24: 100.0000 mg | ORAL_TABLET | Freq: Every day | ORAL | Status: DC

## 2020-10-02 MED ORDER — DILTIAZEM HCL ER COATED BEADS 120 MG PO CP24: 240.0000 mg | ORAL_CAPSULE | Freq: Every day | ORAL | Status: DC

## 2020-10-02 MED ORDER — METOPROLOL SUCCINATE ER 50 MG PO TB24
50.0000 mg | ORAL_TABLET | Freq: Once | ORAL | Status: DC
  Filled 2020-10-02: qty 1

## 2020-10-02 MED ORDER — METOPROLOL SUCCINATE ER 50 MG PO TB24: 50.0000 mg | ORAL_TABLET | Freq: Every day | ORAL | 1 refills | Status: AC

## 2020-10-02 NOTE — TOC Progression Note (Signed)
Transition of Care [facility]) - Progression Note    Patient Details  Name: Gloria Gill MRN: [id number] Date of Birth: Dec 15, 1945  Transition [facility]) CM/SW Contact  Anselm Pancoast, RN Phone Number: 10/02/2020, 8:41 AM  Clinical Narrative:    Spoke to spouse who is eager for patient to return home. States he has not heard anything from the doctor and was unaware patient was staying an additional night. Discussed in detail the need for [facility] and [facility] accepting patient. Will update patient and spouse tomorrow to confirm discharge.    Expected Discharge Plan: [facility] Barriers to Discharge: Continued Medical Work up  Expected Discharge Plan and Services Expected Discharge Plan: [facility]   Discharge Planning Services: CM Consult   Living arrangements for the past 2 months: Single Family Home Expected Discharge Date: 10/01/20                                     Social Determinants of Health (SDOH) Interventions    Readmission Risk Interventions No flowsheet data found.

## 2020-10-02 NOTE — Progress Notes (Signed)
Gloria Gill to be D/C'd Home per MD order.  Discussed prescriptions and follow up appointments with the patient. Prescriptions were e-prescribed, medication list explained in detail. Pt verbalized understanding.  Allergies as of 10/02/2020      Reactions   Lisinopril Nausea Only      Medication List    STOP taking these medications   furosemide 20 MG tablet Commonly known as: LASIX   losartan 25 MG tablet Commonly known as: COZAAR   meloxicam 15 MG tablet Commonly known as: MOBIC   venlafaxine XR 37.5 MG 24 hr capsule Commonly known as: EFFEXOR-XR     TAKE these medications   diltiazem 180 MG 24 hr capsule Commonly known as: CARDIZEM CD Take 1 capsule (180 mg total) by mouth daily. Start taking on: October 03, 2020   Eliquis 5 MG Tabs tablet Generic drug: apixaban Take 5 mg by mouth 2 (two) times daily.   metoprolol succinate 50 MG 24 hr tablet Commonly known as: TOPROL-XL Take 1 tablet (50 mg total) by mouth daily. Take with or immediately following a meal. Start taking on: October 03, 2020 What changed:   medication strength  how much to take  additional instructions   mirtazapine 30 MG tablet Commonly known as: REMERON Take 1 tablet (30 mg total) by mouth at bedtime.   oxybutynin 10 MG 24 hr tablet Commonly known as: DITROPAN-XL Take 10 mg by mouth daily.   oxyCODONE 5 MG immediate release tablet Commonly known as: Oxy IR/ROXICODONE Take 1 tablet (5 mg total) by mouth every 6 (six) hours as needed for moderate pain.   polyethylene glycol 17 g packet Commonly known as: MIRALAX / GLYCOLAX Take 17 g by mouth daily.   senna 8.6 MG Tabs tablet Commonly known as: SENOKOT Take 1 tablet (8.6 mg total) by mouth 2 (two) times daily.   Vitamin D (Ergocalciferol) 1.25 MG (50000 UNIT) Caps capsule Commonly known as: DRISDOL Take 50,000 Units by mouth once a week.            Durable Medical Equipment  (From admission, onward)         Start      Ordered   10/01/20 1223  For home use only DME Walker  Once       Question:  Patient needs a walker to treat with the following condition  Answer:  Balance disorder   10/01/20 1222          Vitals:   10/02/20 0900 10/02/20 1032  BP: 103/69 104/70  Pulse: (!) 117 84  Resp: 18 18  Temp: 97.7 F (36.5 C) 97.8 F (36.6 C)  SpO2: 100% 94%    Skin clean, dry and intact without evidence of skin break down, no evidence of skin tears noted. IV catheter discontinued intact. Site without signs and symptoms of complications. Dressing and pressure applied. Pt denies pain at this time. No complaints noted.  An After Visit Summary was printed and given to the patient. Patient escorted via [place], and D/C home via uber.Stephanie Case manager set up transportation.   [name]

## 2020-10-02 NOTE — Progress Notes (Signed)
Pt educated on nephrostomy tube care, flushes given and instructed to flush twice a day. Pt also instructed to make sure she goes to her f/u appt with Dr. Erlene Quan tomorrow at 4 p.m. pt verbalized understanding.

## 2020-10-02 NOTE — TOC Transition Note (Signed)
Transition of Care [facility]) - CM/SW Discharge Note   Patient Details  Name: Gloria Gill MRN: [id number] Date of Birth: 12/08/1945  Transition of Care [facility]) CM/SW Contact:  Anselm Pancoast, RN Phone Number: 10/02/2020, 12:49 PM   Clinical Narrative:    Spoke to Spouse and confirmed he has a friend coming to get him and they are coming to pick her up within 30 minutes. Spouse reports he will call this Probation officer when he arrives to pick patient up at front entrance.    Final next level of care: Home w Home Health Services Barriers to Discharge: No Barriers Identified   Patient Goals and CMS Choice        Discharge Placement                  Name of family member notified: Husband Patient and family notified of of transfer: 10/02/20  Discharge Plan and Services   Discharge Planning Services: CM Consult                                 Social Determinants of Health ([place]) Interventions     Readmission Risk Interventions No flowsheet data found.

## 2020-10-02 NOTE — TOC Transition Note (Signed)
Transition of Care [facility]) - CM/SW Discharge Note   Patient Details  Name: Gloria Gill MRN: [id number] Date of Birth: August 07, 1945  Transition of Care [facility]) CM/SW Contact:  Beverly Sessions, RN Phone Number: 10/02/2020, 11:41 AM   Clinical Narrative:    Patient to discharge today [facility] arranged for 12pm.  They will call bedside RN when they arrive Helene Kelp with [facility] notified of discharge Bedside RN educate on nephrostomy care prior to discharge  Notified husband of discharge plan and transport time.  He confirms he will be at the home.  He is aware that home health will not be coming out today (day of discharge) or coming out every day.  He is aware that They will have to manage the nephrostomy tube .  RW delivered to the room for discharge   Final next level of care: Home w [place] Barriers to Discharge: No Barriers Identified   Patient Goals and CMS Choice        Discharge Placement                  Name of family member notified: Husband Patient and family notified of of transfer: 10/02/20  Discharge Plan and Services   Discharge Planning Services: CM Consult                                 Social Determinants of Health (SDOH) Interventions     Readmission Risk Interventions No flowsheet data found.

## 2020-10-02 NOTE — Progress Notes (Signed)
Patient seen and examined at the bedside this morning.  She was hemodynamically stable during my evaluation.  Her heart rate has been stable now.  She was discharged yesterday but discharge was canceled because she developed with RVR.  Her medications have been adjusted.  She will continue taking Cardizem long-acting 180 mg daily and metoprolol succinate 50 mg daily.  I have recommended her to follow-up with her cardiologist soon as possible.  Patient is stable for discharge.

## 2020-10-02 NOTE — Telephone Encounter (Signed)
Left VM with patient to notify her of PET scan scheduled tomorrow 3/15 at 3pm at [facility].

## 2020-10-02 NOTE — Progress Notes (Signed)
   10/02/20 0900  Assess: MEWS Score  Temp 97.7 F (36.5 C)  BP 103/69  Pulse Rate (!) 117  Resp 18  SpO2 100 %  Assess: MEWS Score  MEWS Temp 0  MEWS Systolic 0  MEWS Pulse 2  MEWS RR 0  MEWS LOC 0  MEWS Score 2  MEWS Score Color Yellow  Assess: if the MEWS score is Yellow or Red  Were vital signs taken at a resting state? Yes  Focused Assessment Change from prior assessment (see assessment flowsheet)  Early Detection of Sepsis Score *See Row Information* Low  MEWS guidelines implemented *See Row Information* Yes  Treat  MEWS Interventions Administered scheduled meds/treatments;Other (Comment) (Dr. Tamsen Meek notified)  Take Vital Signs  Increase Vital Sign Frequency  Yellow: Q 2hr X 2 then Q 4hr X 2, if remains yellow, continue Q 4hrs  Escalate  MEWS: Escalate Yellow: discuss with charge nurse/RN and consider discussing with provider and RRT  Notify: Charge Nurse/RN  Name of Charge Nurse/RN Notified Erika RN  Date Charge Nurse/RN Notified 10/02/20  Time Charge Nurse/RN Notified 0930  Notify: Provider  Provider Name/Title Dr. Tamsen Meek  Date Provider Notified 10/02/20  Time Provider Notified 0930  Notification Type Face-to-face  Notification Reason Change in status (elevated HR)  Provider response See new orders  Date of Provider Response 10/02/20  Time of Provider Response 0930  Document  Patient Outcome Stabilized after interventions  Progress note created (see row info) Yes  Pt to d/c home today, pt HR elevated after returning to bed from bathroom. Per MD will oder a second dose of metoprolol, monitor HR for 2 hrs is stable can d/c home.will continue to monitor.

## 2020-10-03 ENCOUNTER — Encounter (HOSPITAL_COMMUNITY)
Admission: RE | Admit: 2020-10-03 | Discharge: 2020-10-03 | Disposition: A | Discharge status: Home / Self Care | Payer: Medicare PPO | Source: Ambulatory Visit | Attending: Oncology | Admitting: Oncology

## 2020-10-03 ENCOUNTER — Telehealth: Payer: Self-pay | Admitting: Oncology

## 2020-10-03 ENCOUNTER — Other Ambulatory Visit: Payer: Self-pay

## 2020-10-03 ENCOUNTER — Ambulatory Visit: Payer: Medicare PPO | Admitting: Urology

## 2020-10-03 ENCOUNTER — Telehealth: Payer: Self-pay | Admitting: *Deleted

## 2020-10-03 DIAGNOSIS — C579 Malignant neoplasm of female genital organ, unspecified: Secondary | ICD-10-CM | POA: Insufficient documentation

## 2020-10-03 LAB — GLUCOSE, CAPILLARY: Glucose-Capillary: 92 mg/dL (ref 70–99)

## 2020-10-03 MED ORDER — FLUDEOXYGLUCOSE F - 18 (FDG) INJECTION
8.5000 | Freq: Once | INTRAVENOUS | Status: AC
  Administered 2020-10-03: 8.3 via INTRAVENOUS

## 2020-10-03 NOTE — Telephone Encounter (Signed)
Called pt to notify her of PET scan scheduled today (3/16)at [facility] with her [facility] transport arriving at 2pm. Reminded pt of need to have nothing to eat of drink for 6 hours prior except for water.

## 2020-10-03 NOTE — Telephone Encounter (Signed)
Left VM to return call-needs to reschedule appointment with Dr. Erlene Quan.

## 2020-10-04 ENCOUNTER — Inpatient Hospital Stay: Payer: Medicare PPO | Attending: Hospice and Palliative Medicine | Admitting: Hospice and Palliative Medicine

## 2020-10-04 ENCOUNTER — Other Ambulatory Visit: Payer: Medicare PPO

## 2020-10-04 ENCOUNTER — Telehealth: Payer: Self-pay

## 2020-10-04 DIAGNOSIS — N1330 Unspecified hydronephrosis: Secondary | ICD-10-CM | POA: Insufficient documentation

## 2020-10-04 DIAGNOSIS — Z7901 Long term (current) use of anticoagulants: Secondary | ICD-10-CM | POA: Insufficient documentation

## 2020-10-04 DIAGNOSIS — I5020 Unspecified systolic (congestive) heart failure: Secondary | ICD-10-CM | POA: Insufficient documentation

## 2020-10-04 DIAGNOSIS — C782 Secondary malignant neoplasm of pleura: Secondary | ICD-10-CM | POA: Insufficient documentation

## 2020-10-04 DIAGNOSIS — R45851 Suicidal ideations: Secondary | ICD-10-CM | POA: Insufficient documentation

## 2020-10-04 DIAGNOSIS — Z79899 Other long term (current) drug therapy: Secondary | ICD-10-CM | POA: Insufficient documentation

## 2020-10-04 DIAGNOSIS — Z79891 Long term (current) use of opiate analgesic: Secondary | ICD-10-CM | POA: Insufficient documentation

## 2020-10-04 DIAGNOSIS — F339 Major depressive disorder, recurrent, unspecified: Secondary | ICD-10-CM | POA: Insufficient documentation

## 2020-10-04 DIAGNOSIS — I4891 Unspecified atrial fibrillation: Secondary | ICD-10-CM | POA: Insufficient documentation

## 2020-10-04 DIAGNOSIS — C579 Malignant neoplasm of female genital organ, unspecified: Secondary | ICD-10-CM | POA: Insufficient documentation

## 2020-10-04 DIAGNOSIS — C778 Secondary and unspecified malignant neoplasm of lymph nodes of multiple regions: Secondary | ICD-10-CM | POA: Insufficient documentation

## 2020-10-04 DIAGNOSIS — Z515 Encounter for palliative care: Secondary | ICD-10-CM

## 2020-10-04 DIAGNOSIS — F1011 Alcohol abuse, in remission: Secondary | ICD-10-CM | POA: Insufficient documentation

## 2020-10-04 DIAGNOSIS — I428 Other cardiomyopathies: Secondary | ICD-10-CM | POA: Insufficient documentation

## 2020-10-04 DIAGNOSIS — C7800 Secondary malignant neoplasm of unspecified lung: Secondary | ICD-10-CM | POA: Insufficient documentation

## 2020-10-04 DIAGNOSIS — G893 Neoplasm related pain (acute) (chronic): Secondary | ICD-10-CM | POA: Insufficient documentation

## 2020-10-04 DIAGNOSIS — F419 Anxiety disorder, unspecified: Secondary | ICD-10-CM | POA: Insufficient documentation

## 2020-10-04 NOTE — Progress Notes (Signed)
Tumor Board Documentation  Gloria Gill was presented by Dr Janese Banks at our Tumor Board on 10/04/2020, which included representatives from medical oncology,radiation oncology,navigation,pathology,radiology,surgical,pharmacy,genetics,research,palliative care,pulmonology.  Gloria Gill currently presents as a new patient,for MDC,for new positive pathology with history of the following treatments: surgical intervention(s).  Additionally, we reviewed previous medical and familial history, history of present illness, and recent lab results along with all available histopathologic and imaging studies. The tumor board considered available treatment options and made the following recommendations:   Refer to GYN Oncology  The following procedures/referrals were also placed: No orders of the defined types were placed in this encounter.   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  Tumor board is a meeting of clinicians from various specialty areas who evaluate and discuss patients for whom a multidisciplinary approach is being considered. Final determinations in the plan of care are those of the provider(s). The responsibility for follow up of recommendations given during tumor board is that of the provider.   Today's extended care, comprehensive team conference, Gloria Gill was not present for the discussion and was not examined.   Multidisciplinary Tumor Board is a multidisciplinary case peer review process.  Decisions discussed in the Multidisciplinary Tumor Board reflect the opinions of the specialists present at the conference without having examined the patient.  Ultimately, treatment and diagnostic decisions rest with the primary provider(s) and the patient.

## 2020-10-04 NOTE — Progress Notes (Signed)
I was unable to reach patient for scheduled MyChart visit.  Voicemail left.  Will plan to see patient when she returns to the clinic next week.

## 2020-10-04 NOTE — Telephone Encounter (Signed)
Error

## 2020-10-05 ENCOUNTER — Telehealth: Payer: Self-pay

## 2020-10-05 NOTE — Telephone Encounter (Signed)
10/05/2020-LMOM to update patient that we added her to Dr.Rao's schedule at 11 am, and provided a call back number for patient if she has any questions.

## 2020-10-09 ENCOUNTER — Inpatient Hospital Stay (HOSPITAL_BASED_OUTPATIENT_CLINIC_OR_DEPARTMENT_OTHER): Payer: Medicare PPO | Admitting: Oncology

## 2020-10-09 ENCOUNTER — Inpatient Hospital Stay (HOSPITAL_BASED_OUTPATIENT_CLINIC_OR_DEPARTMENT_OTHER): Payer: Medicare PPO | Admitting: Hospice and Palliative Medicine

## 2020-10-09 ENCOUNTER — Inpatient Hospital Stay (HOSPITAL_BASED_OUTPATIENT_CLINIC_OR_DEPARTMENT_OTHER): Payer: Medicare PPO | Admitting: Obstetrics and Gynecology

## 2020-10-09 ENCOUNTER — Telehealth: Payer: Self-pay

## 2020-10-09 VITALS — BP 112/73 | HR 73 | Temp 97.5°F | Resp 20 | Ht 69.0 in | Wt 155.8 lb

## 2020-10-09 DIAGNOSIS — F339 Major depressive disorder, recurrent, unspecified: Secondary | ICD-10-CM | POA: Diagnosis not present

## 2020-10-09 DIAGNOSIS — N1330 Unspecified hydronephrosis: Secondary | ICD-10-CM | POA: Diagnosis not present

## 2020-10-09 DIAGNOSIS — Z5111 Encounter for antineoplastic chemotherapy: Secondary | ICD-10-CM | POA: Diagnosis not present

## 2020-10-09 DIAGNOSIS — F419 Anxiety disorder, unspecified: Secondary | ICD-10-CM

## 2020-10-09 DIAGNOSIS — C579 Malignant neoplasm of female genital organ, unspecified: Secondary | ICD-10-CM | POA: Diagnosis present

## 2020-10-09 DIAGNOSIS — Z515 Encounter for palliative care: Secondary | ICD-10-CM | POA: Diagnosis not present

## 2020-10-09 DIAGNOSIS — F1011 Alcohol abuse, in remission: Secondary | ICD-10-CM | POA: Diagnosis not present

## 2020-10-09 DIAGNOSIS — R45851 Suicidal ideations: Secondary | ICD-10-CM | POA: Diagnosis not present

## 2020-10-09 DIAGNOSIS — I4891 Unspecified atrial fibrillation: Secondary | ICD-10-CM | POA: Diagnosis not present

## 2020-10-09 DIAGNOSIS — G893 Neoplasm related pain (acute) (chronic): Secondary | ICD-10-CM | POA: Diagnosis not present

## 2020-10-09 DIAGNOSIS — Z7189 Other specified counseling: Secondary | ICD-10-CM | POA: Diagnosis not present

## 2020-10-09 DIAGNOSIS — R599 Enlarged lymph nodes, unspecified: Secondary | ICD-10-CM | POA: Diagnosis not present

## 2020-10-09 DIAGNOSIS — C7800 Secondary malignant neoplasm of unspecified lung: Secondary | ICD-10-CM

## 2020-10-09 DIAGNOSIS — C782 Secondary malignant neoplasm of pleura: Secondary | ICD-10-CM | POA: Diagnosis not present

## 2020-10-09 DIAGNOSIS — N135 Crossing vessel and stricture of ureter without hydronephrosis: Secondary | ICD-10-CM

## 2020-10-09 DIAGNOSIS — I5020 Unspecified systolic (congestive) heart failure: Secondary | ICD-10-CM | POA: Diagnosis not present

## 2020-10-09 DIAGNOSIS — Z79899 Other long term (current) drug therapy: Secondary | ICD-10-CM | POA: Diagnosis not present

## 2020-10-09 DIAGNOSIS — C530 Malignant neoplasm of endocervix: Secondary | ICD-10-CM | POA: Diagnosis not present

## 2020-10-09 DIAGNOSIS — Z79891 Long term (current) use of opiate analgesic: Secondary | ICD-10-CM | POA: Diagnosis not present

## 2020-10-09 DIAGNOSIS — Z7901 Long term (current) use of anticoagulants: Secondary | ICD-10-CM | POA: Diagnosis not present

## 2020-10-09 DIAGNOSIS — C778 Secondary and unspecified malignant neoplasm of lymph nodes of multiple regions: Secondary | ICD-10-CM | POA: Diagnosis not present

## 2020-10-09 DIAGNOSIS — I428 Other cardiomyopathies: Secondary | ICD-10-CM | POA: Diagnosis not present

## 2020-10-09 MED ORDER — LORAZEPAM 0.5 MG PO TABS: 0.5000 mg | ORAL_TABLET | Freq: Three times a day (TID) | ORAL | 0 refills | Status: AC | PRN

## 2020-10-09 MED ORDER — OXYCODONE HCL 5 MG PO TABS: 5.0000 mg | ORAL_TABLET | Freq: Four times a day (QID) | ORAL | 0 refills | Status: DC | PRN

## 2020-10-09 NOTE — Telephone Encounter (Signed)
[facility] pathology review requested on specimen [id number], collected 09/24/2020, bladder and trigone biopsy. [facility] CDx and PD-L1 request for [phone], collected 09/24/2020, bladder and trigone biopsy. Pt has Medicare and requires 14 days post discharge from hospital prior to testing. Discharge date 10/02/2020. Will send request to pathology on 10/16/2020

## 2020-10-09 NOTE — Patient Instructions (Signed)
Cervical Cancer  Cervical cancer is abnormal growth of cells in the cervix. The cervix is the opening and bottom part of the uterus. It is between the vagina and the uterus. There are three main types of cervical cancer:  Squamous cell carcinoma. This cancer starts in the cells that line the surface of the cervix.  Adenocarcinoma. This cancer starts in the gland cells that line the cervix.  Cervical sarcoma. This is a rare cervical tumor that grows and spreads very fast. What are the causes? Most cervical cancers are caused by a virus called human papillomavirus (HPV). What increases the risk? This condition is more likely to develop in women who have a certain sexual history, family or genetic factors, or other medical conditions. Sexual history  You have a sexually transmitted infection (STI). These include: ? Chlamydia. ? Herpes. ? HPV.  You have more than one sexual partner, or you are having sex with someone who has more than one sexual partner.  You do not use condoms correctly every time you have sex.  You were sexually active before the age of 67. Family or genetic factors  Your mother took diethylstilbestrol (DES) during pregnancy.  Your mother or sister has had cervical cancer. Other medical conditions  You are between the ages of 57-50.  You have had cancer of the vagina or vulva.  You use birth control pills.  You smoke or regularly breathe in second hand smoke.  You have a weakened immune system.  You have a history of dysplasia of the cervix. What are the signs or symptoms? There are no symptoms during the early stages of cervical cancer. Once the cancer is in the cervix and spreads to nearby tissues, symptoms may include:  Abnormal vaginal discharge, bleeding or a period that is longer or heavier than usual.  Vaginal bleeding after sex, douching, or a Pap test.  Vaginal bleeding after menopause.  Pelvic discomfort or pain during sex.  An abnormal  Pap test.  Being very tired. How is this diagnosed? This condition is diagnosed based on your medical history and a physical exam, which includes a pelvic exam and Pap test. Your health care provider may also do:  A colposcopy. A microscope is used to closely check the cells of the cervix, vagina, and vulva.  A cervical biopsy. Small samples of tissue from the cervix are removed and checked under a microscope.  A cone biopsy. Tissue with cancer cells is removed and tested.  Imaging tests, including ultrasound, CT scan, MRI, or PET scan. If cervical cancer is found, it will be staged. Staging assesses tumor size and if and where the cancer has spread. How is this treated? Treatment for this condition depends on the cancer stage and may include:  Cone biopsy to remove the cancerous tissue.  Removing the entire uterus and cervix.  Removing the uterus, cervix, upper vagina, lymph nodes, and nearby tissue. The ovaries may be left or removed.  Medicines to treat cancer. These include chemotherapy or targeted therapy.  A combination of surgery, radiation, and chemotherapy.  Biological therapy. These are substances that strengthen your immune system's fight against cancer or infection. They may be used with chemotherapy. Follow these instructions at home: Medicines  Take over-the-counter and prescription medicines only as told by your health care provider. General instructions  Do not use any products that contain nicotine or tobacco, such as cigarettes, e-cigarettes, and chewing tobacco. If you need help quitting, ask your health care provider.  Do not  have sex until your health care provider says it is safe.  Use a condom correctly every time you have sex.  Consider joining a support group with others who have a diagnosis of cervical cancer.  Keep all follow-up visits as told by your health care provider. This is important. Where to find more information  [place]: www.cancer.gov  American Cancer Society: www.cancer.org  [employer]: www.acog.org Contact a health care provider if:  You have pelvic pain or pressure.  You have leg or back pain.  You have a fever.  You have abnormal vaginal bleeding or discharge.  You lose weight.  You develop a cough. Get help right away if:  You cannot urinate.  You have blood in your urine.  You have blood in your stool.  You develop severe back, stomach, or pelvic pain. Summary  Cervical cancer is abnormal growth of cells in the cervix.  Most cervical cancers are caused by a virus called human papillomavirus (HPV).  Treatment for this condition depends on the stage of the cancer. Treatment may include a combination of surgery, radiation, and chemotherapy.  Getting vaccinated with the HPV vaccine can prevent most cervical cancers. This information is not intended to replace advice given to you by your health care provider. Make sure you discuss any questions you have with your health care provider. Document Revised: 07/08/2019 Document Reviewed: 07/08/2019 Elsevier Patient Education  2021 [employer].

## 2020-10-09 NOTE — Progress Notes (Signed)
Gynecologic Oncology Consult Visit   Referring Provider: Dr. Janese Banks  Chief Complaint: Gynecologic Malignancy  Subjective:  Gloria Gill is a 75 y.o. G0P0 female who is seen in consultation from Dr. Janese Banks for gynecologic malignancy. She has a history of A. fib on Eliquis, alcohol induced cardiomyopathy, previous history of chronic alcohol abuse who presented with suprapubic pain and elevated creatinine.   She was noted to have AKI with an elevated serum creatinine of 3.08 on 09/19/2020. She underwent CT abdomen and pelvis without contrast which showed bilateral hydronephrosis of unknown etiology with ureters dilated to the level of bladder.   09/24/2020 She underwent cystoscopy TURBT and right ureteral stent placement with biopsies.    Intraoperative findings: Infiltrative nodular type mass involving trigone and left bladder neck, approximately 2 cm with other areas in the bladder which were also suspicious.  Irregular nondiscrete borders.  Bilateral hydroureteronephrosis down to the level of the bladder. R stent placed; but unable to place L stent.     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    BLADDER; TARGETED BIOPSIES:  - INVOLVEMENT BY ADENOCARCINOMA OF GYNE ORIGIN.  - SEE COMMENT.   B TRIGONE; BIOPSY:  - INVOLVEMENT BY ADENOCARCINOMA OF GYNE ORIGIN.  - SEE COMMENT.   Comment:  Moderately to focal poorly differentiated adenocarcinoma is present in  both of the above bladder specimens. The majority of the adenocarcinoma  involves the deeper aspects of the biopsy fragments with focal areas of  adenocarcinoma undermining and eroding into the overlying urothelium. In  situ urothelial carcinoma is not identified.  A limited panel of immunohistochemical stains was performed in the  malignant cells demonstrate the following pattern of immunoreactivity:  Cytokeratin 7: Positive  Cytokeratin 20: Positive  PAX 8: Positive  ER: Positive, focal  P16:  Negative  CDX2: Negative  The histologic findings in conjunction with the pattern of  immunoreactivity are consistent with involvement of the bladder by  mucinous adenocarcinoma of gyne origin (endocervix versus ovary).   09/26/2020 Left PCN placed  10/03/2020 PET scan CHEST: 5.5 mm nodule adjacent to the descending thoracic aorta on image 43/3 is hypermetabolic with SUV max of 2.95. This could be a pleural metastasis. Vague peripheral nodular lesions at the left lung base are hypermetabolic and although do not appear as discrete metastatic nodules could be atypical metastatic disease. SUV max ranges between 5.42 and 7.52.  No enlarged or hypermetabolic mediastinal or hilar lymph nodes.  Small epicardial nodes just above the hemidiaphragm are also hypermetabolic and worrisome for metastatic disease.  No breast masses, supraclavicular or axillary adenopathy.  Incidental CT findings: Small bilateral pleural effusions and bibasilar atelectasis. Moderate cardiac enlargement.  ABDOMEN/PELVIS: Right-sided double-J ureteral stent and left-sided nephrostomy tube with persistent hydronephrosis and significant activity involving both kidneys. Extensive retroperitoneal lymphadenopathy is hypermetabolic with SUV max of 1.88. This continues down along the iliac chains and into the pelvis.  The uterus demonstrates marked hypermetabolism with SUV max of 9.29. This also involves the entire cervix. The cervical hypermetabolism is 11.04. Marked hypermetabolism of both ovaries is also noted. The right is 9.58 and the left is 9.62. There are also small hypermetabolic pelvic sidewall lymph nodes. Small node on the left measures 6 mm and the SUV max is 5.80. A lateral right-sided external iliac lymph node measures 7.5 mm and the SUV max is 14.46.  Small but hypermetabolic inguinal lymph nodes consistent with metastatic disease.  Incidental CT findings: none  10/04/2020 Tumor Board  recommendation  to refer to [facility].   She can not remember her last Pap smear. She c/o weight loss, discomfort with the L PCN, and diarrhea. She has no complaints of vaginal bleeding.     Problem List: Patient Active Problem List   Diagnosis Date Noted  . Gynecologic malignancy ([place]) 10/09/2020  . Palliative care encounter   . Atrial fibrillation with rapid ventricular response ([place]) 09/20/2020  . AKI (acute kidney injury) ([place]) 09/19/2020  . HFrEF (heart failure with reduced ejection fraction) ([place]) 09/19/2020  . Hydronephrosis 09/19/2020  . Suicidal ideation 09/19/2020  . Severe recurrent major depression without psychotic features ([place]) 09/19/2020  . 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 Alcohol abuse 05/06/2018  . Cardiomyopathy ([place]) 05/06/2018  . Anxiety, generalized 05/06/2018  . Atrial fibrillation ([place]) 12/08/2017    Past Medical History: Past Medical History:  Diagnosis Date  . Atrial fibrillation ([place])   . Hypertension     Past Surgical History: Past Surgical History:  Procedure Laterality Date  . APPENDECTOMY    . BREAST SURGERY    . CYSTOGRAM  09/24/2020   Procedure: CYSTOGRAM;  Surgeon: Hollice Espy, MD;  Location: [facility] ORS;  Service: Urology;;  . Consuela Mimes W/ RETROGRADES Bilateral 09/24/2020   Procedure: CYSTOSCOPY WITH RETROGRADE PYELOGRAM;  Surgeon: Hollice Espy, MD;  Location: [facility] ORS;  Service: Urology;  Laterality: Bilateral;  . CYSTOSCOPY WITH BIOPSY N/A 09/24/2020   Procedure: CYSTOSCOPY WITH BIOPSY;  Surgeon: Hollice Espy, MD;  Location: [facility] ORS;  Service: Urology;  Laterality: N/A;  . CYSTOSCOPY WITH STENT PLACEMENT Bilateral 09/24/2020   Procedure: CYSTOSCOPY WITH RIGHT STENT PLACEMENT;  Surgeon: Hollice Espy, MD;  Location: [facility] ORS;  Service: Urology;  Laterality: Bilateral;  . IR NEPHROSTOMY PLACEMENT LEFT  09/26/2020  . REDUCTION MAMMAPLASTY Bilateral 1981   pt stated she had implants with these scars/had implants removed 2009  .  TRANSURETHRAL RESECTION OF BLADDER TUMOR N/A 09/24/2020   Procedure: TRANSURETHRAL RESECTION OF BLADDER TUMOR (TURBT);  Surgeon: Hollice Espy, MD;  Location: [facility] ORS;  Service: Urology;  Laterality: N/A;    Past Gynecologic History:  Menarche: unkonwn Menstrual details: post menopausal History of Abnormal pap: unknown Last pap: unknown   OB History:  OB History  No obstetric history on file.    Family History: Family History  Problem Relation Age of Onset  . Breast cancer Neg Hx     Social History: Social History   Socioeconomic History  . Marital status: Married    Spouse name: Not on file  . Number of children: Not on file  . Years of education: Not on file  . Highest education level: Not on file  Occupational History  . Not on file  Tobacco Use  . Smoking status: Never Smoker  . Smokeless tobacco: Never Used  Substance and Sexual Activity  . Alcohol use: Not Currently  . Drug use: Never  . Sexual activity: Not on file  Other Topics Concern  . Not on file  Social History Narrative  . Not on file   Social Determinants of Health   Financial Resource Strain: Not on file  Food Insecurity: Not on file  Transportation Needs: Not on file  Physical Activity: Not on file  Stress: Not on file  Social Connections: Not on file  Intimate Partner Violence: Not on file    Allergies: Allergies  Allergen Reactions  . Lisinopril Nausea Only    Current Medications: Current Outpatient Medications  Medication Sig Dispense Refill  . diltiazem (CARDIZEM CD) 180  MG 24 hr capsule Take 1 capsule (180 mg total) by mouth daily. 30 capsule 1  . ELIQUIS 5 MG TABS tablet Take 5 mg by mouth 2 (two) times daily.    . metoprolol succinate (TOPROL-XL) 50 MG 24 hr tablet Take 1 tablet (50 mg total) by mouth daily. Take with or immediately following a meal. 30 tablet 1  . mirtazapine (REMERON) 30 MG tablet Take 1 tablet (30 mg total) by mouth at bedtime. 60 tablet 0  . oxyCODONE  (OXY IR/ROXICODONE) 5 MG immediate release tablet Take 1 tablet (5 mg total) by mouth every 6 (six) hours as needed for moderate pain. 15 tablet 0  . Vitamin D, Ergocalciferol, (DRISDOL) 1.25 MG (50000 UNIT) CAPS capsule Take 50,000 Units by mouth once a week.    Marland Kitchen oxybutynin (DITROPAN-XL) 10 MG 24 hr tablet Take 10 mg by mouth daily. (Patient not taking: Reported on 10/09/2020)    . polyethylene glycol (MIRALAX / GLYCOLAX) 17 g packet Take 17 g by mouth daily. (Patient not taking: Reported on 10/09/2020) 30 each 0  . senna (SENOKOT) 8.6 MG TABS tablet Take 1 tablet (8.6 mg total) by mouth 2 (two) times daily. (Patient not taking: Reported on 10/09/2020) 60 tablet 0   No current facility-administered medications for this visit.    Review of Systems.  General: weight loss o/w negative for fevers or night sweats Skin: negative for changes in moles or sores or rash Eyes: chronic blurry vision HEENT: negative for change in hearing, tinnitus, voice changes Pulmonary: negative for dyspnea, orthopnea, productive cough, wheezing Cardiac: negative for palpitations, pain Gastrointestinal: h/o constipation with episode diarrhea, o/w negative for nausea, vomiting, hematemesis, hematochezia Genitourinary/Sexual: negative for dysuria, retention, hematuria, incontinence Ob/Gyn:  negative for abnormal bleeding, or pain Musculoskeletal: negative for pain, joint pain, back pain Hematology: negative for easy bruising, abnormal bleeding Neurologic/Psych: weakness o/w negative for headaches, seizures, paralysis, numbness   Objective:  Physical Examination:  BP 112/73 (Patient Position: Supine)   Pulse 73   Temp (!) 97.5 F (36.4 C)   Resp 20   Ht 5' 9"  (1.753 m)   Wt 155 lb 12.8 oz (70.7 kg)   BMI 23.01 kg/m     ECOG Performance Status: 2 - Symptomatic, <50% confined to bed  GENERAL: pale ill appearing female in no acute distress HEENT:  PERRL, neck supple with midline trachea. Thyroid without masses.   NODES: positive right inguinal lymphadenopathy palpated ~1.5-2 cm firm node o/w  No cervical, supraclavicular, axillary lymphadenopathy palpated.  LUNGS:  Clear to auscultation bilaterally.  No wheezes or rhonchi. HEART:  Regular rate and rhythm.  ABDOMEN:  Soft, tender suprapubic region. No obvious masses but very firm suprapubic area. No ascites/hernias.   MSK:  No focal spinal tenderness to palpation. EXTREMITIES:  No peripheral edema.   SKIN:  Clear with no obvious rashes or skin changes. No nail dyscrasia. NEURO:  Nonfocal. Well oriented.  Appropriate affect.  Pelvic: Chaperoned by nursing EGBUS: no lesions Cervix: unable to see cervix. She is unable to tolerate the exam.  Vagina: speculum could only pass to upper portion of vagina. No lesions lower 1/3 vagina. Positive watery discharge, but no bleeding Uterus: unable to determine size BME: nodularity palpated approximately 3 cm superior to the hymenal ring with involvement of the vaginal wall circumferentially; extension to the pelvic sidewalls bilaterally with firm parametrial involvment Rectovaginal: confirmatory with palpable large mass; no obvious rectal mucosal involvement.   Lab Review Labs on site today: Lab Results  Component Value Date   WBC 10.7 (H) 10/01/2020   HGB 12.1 10/01/2020   HCT 36.0 10/01/2020   MCV 92.8 10/01/2020   PLT 323 10/01/2020      Chemistry      Component Value Date/Time   NA 134 (L) 09/29/2020 0535   K 3.8 09/29/2020 0535   CL 103 09/29/2020 0535   CO2 23 09/29/2020 0535   BUN 16 09/29/2020 0535   CREATININE 0.90 09/29/2020 0535      Component Value Date/Time   CALCIUM 8.6 (L) 09/29/2020 0535   ALKPHOS 49 09/19/2020 0945   AST 25 09/19/2020 0945   ALT 21 09/19/2020 0945   BILITOT 0.8 09/19/2020 0945       Radiologic Imaging: 09/19/2020 CT Abdomen/pelvis EXAM: CT ABDOMEN AND PELVIS WITHOUT CONTRAST  TECHNIQUE: Multidetector CT imaging of the abdomen and pelvis was  performed following the standard protocol without IV contrast.  COMPARISON:  03/20/2005  FINDINGS: Lower chest: Small right pleural effusion.  No confluent opacities.  Hepatobiliary: No focal hepatic abnormality. Gallbladder unremarkable.  Pancreas: No focal abnormality or ductal dilatation.  Spleen: No focal abnormality.  Normal size.  Adrenals/Urinary Tract: Bilateral hydronephrosis. No renal or ureteral stones. The ureters are dilated to the urinary bladder which is decompressed. Adrenal glands unremarkable.  Stomach/Bowel: Colonic diverticulosis diffusely. No active diverticulitis. Stomach and small bowel decompressed, unremarkable.  Vascular/Lymphatic: Aortic atherosclerosis. No evidence of aneurysm or adenopathy.  Reproductive: Uterus and adnexa unremarkable.  No mass.  Other: Free fluid noted in the pelvis anterior to the uterus. No free air.  Musculoskeletal: No acute bony abnormality.  IMPRESSION: Diffuse colonic diverticulosis.  No active diverticulitis.  Bilateral hydronephrosis of unknown etiology. Ureters are dilated to the bladder. No visible stones. Bladder is decompressed.  Small amount of free fluid in the pelvis.  Aortic atherosclerosis.  Small right pleural effusion.   10/03/2020 PET CLINICAL DATA:  Initial treatment strategy for metastatic cancer of unknown primary.  EXAM: NUCLEAR MEDICINE PET SKULL BASE TO THIGH  TECHNIQUE: 8.3 mCi F-18 FDG was injected intravenously. Full-ring PET imaging was performed from the skull base to thigh after the radiotracer. CT data was obtained and used for attenuation correction and anatomic localization.  Fasting blood glucose: 92 mg/dl  COMPARISON:  CT scan 09/19/2020  FINDINGS: Mediastinal blood pool activity: SUV max 3.33  Liver activity: SUV max NA  NECK: Diffuse symmetric uptake around the vocal cords likely related to vocalization/phonation. No masses identified. No  neck adenopathy the.  Incidental CT findings: none  CHEST: 5.5 mm nodule adjacent to the descending thoracic aorta on image 52/8 is hypermetabolic with SUV max of 4.13. This could be a pleural metastasis. Vague peripheral nodular lesions at the left lung base are hypermetabolic and although do not appear as discrete metastatic nodules could be atypical metastatic disease. SUV max ranges between 5.42 and 7.52.  No enlarged or hypermetabolic mediastinal or hilar lymph nodes.  Small epicardial nodes just above the hemidiaphragm are also hypermetabolic and worrisome for metastatic disease.  No breast masses, supraclavicular or axillary adenopathy.  Incidental CT findings: Small bilateral pleural effusions and bibasilar atelectasis. Moderate cardiac enlargement.  ABDOMEN/PELVIS: Right-sided double-J ureteral stent and left-sided nephrostomy tube with persistent hydronephrosis and significant activity involving both kidneys. Extensive retroperitoneal lymphadenopathy is hypermetabolic with SUV max of 2.44. This continues down along the iliac chains and into the pelvis.  The uterus demonstrates marked hypermetabolism with SUV max of 9.29. This also involves the entire cervix. The cervical hypermetabolism is  11.04. Marked hypermetabolism of both ovaries is also noted. The right is 9.58 and the left is 9.62. There are also small hypermetabolic pelvic sidewall lymph nodes. Small node on the left measures 6 mm and the SUV max is 5.80. A lateral right-sided external iliac lymph node measures 7.5 mm and the SUV max is 14.46.  Small but hypermetabolic inguinal lymph nodes consistent with metastatic disease.  Incidental CT findings: none  SKELETON: No findings suspicious for osseous metastatic disease.  Incidental CT findings: none  IMPRESSION: 1. PET-CT findings consistent with gynecologic malignancy. Suspect endometrial carcinoma involving the cervix and both ovaries  with extensive pelvic and abdominal retroperitoneal metastatic disease. 2. Left-sided pleural and pulmonary metastatic disease suspected.     Assessment:  Gloria Gill is a 75 y.o. 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    Medical co-morbidities complicating care: Afib, h/o heart failure, suicidal ideation, depression, and h/o alcohol abuse  Plan:   Problem List Items Addressed This Visit      Genitourinary   Gynecologic malignancy [facility]) - Primary    Other Visit Diagnoses    Obstruction of ureter, unspecified laterality       Malignant neoplasm metastatic to lung, unspecified laterality ([place])       Adenopathy          We discussed options for management including chemotherapy, radiation, and Hospice. Given extensive stage IV disease that is not amendable to resection recommend systemic chemotherapy with paclitaxel and cisplatin (cisplatin was preferred in patients not previously treated with platin based therapy; carboplatin is an option given h/o AKI) and add pembrolizumab if PD-L1 + ([id number]). We did not recommend bevacizumab given extensive bladder involvement and risk of fistulization, but that could also be incorporated in her regimen. We reviewed median OS with treatment ranging from 12 months with chemotherapy alone up to 24 months with the incorporation of pembrolizumab. She is very interested in treatment. She will see Dr. Janese Banks today and will continue to follow up with Dr. Billey Chang for Palliative care.   Request PD-L1 testing.   If she is still having pain and does not have a response to systemic therapy, then consider palliative radiation therapy.   Send  pathology [facility] at [facility] to rule out gastric type cervical adenocarinoma and other rare subtypes.   The patient's diagnosis, an outline of the further diagnostic and laboratory studies which will be required, the recommendation for surgery, and alternatives were discussed with her and her accompanying family members.  All questions were answered to their satisfaction.  A total of 80 minutes were spent with the patient/family today; >50% was spent in education, counseling and coordination of care for suspected cervical adenocarcinoma.   Beckey Rutter, NP  I personally had a face to face interaction and evaluated the patient jointly with the NP, Ms. Beckey Rutter.  I have reviewed her history and available records and have performed the key portions of the physical exam including lymph node survey, abdominal exam, pelvic exam with my findings confirming those documented above by the APP.  I have discussed the case with the APP and the patient.  I agree with the above documentation, assessment and plan which was fully formulated by me.  Counseling was completed by me.   I personally  saw the patient and performed a substantive portion of this encounter in conjunction with the listed APP as documented above.  Angeles Gaetana Michaelis, MD
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
## 2020-10-10 ENCOUNTER — Telehealth: Payer: Self-pay | Admitting: Primary Care

## 2020-10-10 NOTE — Telephone Encounter (Signed)
Called patient to offer to schedule a Palliative Consult in the home, no answer.  Left message with reason for call along with my name and call back number.  Requested a call back as soon as possible because Palliative NP has availability to see her tomorrow, 10/11/20.

## 2020-10-10 NOTE — Telephone Encounter (Signed)
Called patient's husband, Percell Miller, to let him know that I was trying to reach patient to schedule a Palliative Consult with our NP and that I had left patient a VM earlier but have not heard back from her.  Explained to him that I needed someone to call me back as soon as possible because the NP does have availability to see patient tomorrow and I didn't want that appointment to get taken.  Left my name and call back number.

## 2020-10-11 ENCOUNTER — Telehealth: Payer: Self-pay | Admitting: Primary Care

## 2020-10-11 NOTE — Telephone Encounter (Signed)
Error, wrong date

## 2020-10-11 NOTE — Telephone Encounter (Signed)
At the request of the Palliative NP she wanted me to contact the patient again to see if I could get the Palliative Consult scheduled for tomorrow morning.  Called patient back with no answer and left another voicemail asking for a return call to schedule visit.  Notified NP.

## 2020-10-12 ENCOUNTER — Telehealth (INDEPENDENT_AMBULATORY_CARE_PROVIDER_SITE_OTHER): Payer: Self-pay

## 2020-10-12 NOTE — Patient Instructions (Signed)
Paclitaxel injection What is this medicine? PACLITAXEL (PAK li TAX el) is a chemotherapy drug. It targets fast dividing cells, like cancer cells, and causes these cells to die. This medicine is used to treat ovarian cancer, breast cancer, lung cancer, Kaposi's sarcoma, and other cancers. This medicine may be used for other purposes; ask your health care provider or pharmacist if you have questions. COMMON BRAND NAME(S): Onxol, Taxol What should I tell my health care provider before I take this medicine? They need to know if you have any of these conditions:  history of irregular heartbeat  liver disease  low blood counts, like low white cell, platelet, or red cell counts  lung or breathing disease, like asthma  tingling of the fingers or toes, or other nerve disorder  an unusual or allergic reaction to paclitaxel, alcohol, polyoxyethylated castor oil, other chemotherapy, other medicines, foods, dyes, or preservatives  pregnant or trying to get pregnant  breast-feeding How should I use this medicine? This drug is given as an infusion into a vein. It is administered in a hospital or clinic by a specially trained health care professional. Talk to your pediatrician regarding the use of this medicine in children. Special care may be needed. Overdosage: If you think you have taken too much of this medicine contact a poison control center or emergency room at once. NOTE: This medicine is only for you. Do not share this medicine with others. What if I miss a dose? It is important not to miss your dose. Call your doctor or health care professional if you are unable to keep an appointment. What may interact with this medicine? Do not take this medicine with any of the following medications:  live virus vaccines This medicine may also interact with the following medications:  antiviral medicines for hepatitis, HIV or AIDS  certain antibiotics like erythromycin and clarithromycin  certain  medicines for fungal infections like ketoconazole and itraconazole  certain medicines for seizures like carbamazepine, phenobarbital, phenytoin  gemfibrozil  nefazodone  rifampin  St. John's wort This list may not describe all possible interactions. Give your health care provider a list of all the medicines, herbs, non-prescription drugs, or dietary supplements you use. Also tell them if you smoke, drink alcohol, or use illegal drugs. Some items may interact with your medicine. What should I watch for while using this medicine? Your condition will be monitored carefully while you are receiving this medicine. You will need important blood work done while you are taking this medicine. This medicine can cause serious allergic reactions. To reduce your risk you will need to take other medicine(s) before treatment with this medicine. If you experience allergic reactions like skin rash, itching or hives, swelling of the face, lips, or tongue, tell your doctor or health care professional right away. In some cases, you may be given additional medicines to help with side effects. Follow all directions for their use. This drug may make you feel generally unwell. This is not uncommon, as chemotherapy can affect healthy cells as well as cancer cells. Report any side effects. Continue your course of treatment even though you feel ill unless your doctor tells you to stop. Call your doctor or health care professional for advice if you get a fever, chills or sore throat, or other symptoms of a cold or flu. Do not treat yourself. This drug decreases your body's ability to fight infections. Try to avoid being around people who are sick. This medicine may increase your risk to bruise   or bleed. Call your doctor or health care professional if you notice any unusual bleeding. Be careful brushing and flossing your teeth or using a toothpick because you may get an infection or bleed more easily. If you have any dental  work done, tell your dentist you are receiving this medicine. Avoid taking products that contain aspirin, acetaminophen, ibuprofen, naproxen, or ketoprofen unless instructed by your doctor. These medicines may hide a fever. Do not become pregnant while taking this medicine. Women should inform their doctor if they wish to become pregnant or think they might be pregnant. There is a potential for serious side effects to an unborn child. Talk to your health care professional or pharmacist for more information. Do not breast-feed an infant while taking this medicine. Men are advised not to father a child while receiving this medicine. This product may contain alcohol. Ask your pharmacist or healthcare provider if this medicine contains alcohol. Be sure to tell all healthcare providers you are taking this medicine. Certain medicines, like metronidazole and disulfiram, can cause an unpleasant reaction when taken with alcohol. The reaction includes flushing, headache, nausea, vomiting, sweating, and increased thirst. The reaction can last from 30 minutes to several hours. What side effects may I notice from receiving this medicine? Side effects that you should report to your doctor or health care professional as soon as possible:  allergic reactions like skin rash, itching or hives, swelling of the face, lips, or tongue  breathing problems  changes in vision  fast, irregular heartbeat  high or low blood pressure  mouth sores  pain, tingling, numbness in the hands or feet  signs of decreased platelets or bleeding - bruising, pinpoint red spots on the skin, black, tarry stools, blood in the urine  signs of decreased red blood cells - unusually weak or tired, feeling faint or lightheaded, falls  signs of infection - fever or chills, cough, sore throat, pain or difficulty passing urine  signs and symptoms of liver injury like dark yellow or brown urine; general ill feeling or flu-like symptoms;  light-colored stools; loss of appetite; nausea; right upper belly pain; unusually weak or tired; yellowing of the eyes or skin  swelling of the ankles, feet, hands  unusually slow heartbeat Side effects that usually do not require medical attention (report to your doctor or health care professional if they continue or are bothersome):  diarrhea  hair loss  loss of appetite  muscle or joint pain  nausea, vomiting  pain, redness, or irritation at site where injected  tiredness This list may not describe all possible side effects. Call your doctor for medical advice about side effects. You may report side effects to FDA at 1-800-FDA-1088. Where should I keep my medicine? This drug is given in a hospital or clinic and will not be stored at home. NOTE: This sheet is a summary. It may not cover all possible information. If you have questions about this medicine, talk to your doctor, pharmacist, or health care provider.  2021 [employer] Standard (2019-06-08 13:37:23) Carboplatin injection What is this medicine? CARBOPLATIN (KAR boe pla tin) is a chemotherapy drug. It targets fast dividing cells, like cancer cells, and causes these cells to die. This medicine is used to treat ovarian cancer and many other cancers. This medicine may be used for other purposes; ask your health care provider or pharmacist if you have questions. COMMON BRAND NAME(S): Paraplatin What should I tell my health care provider before I take this medicine? They need to   know if you have any of these conditions:  blood disorders  hearing problems  kidney disease  recent or ongoing radiation therapy  an unusual or allergic reaction to carboplatin, cisplatin, other chemotherapy, other medicines, foods, dyes, or preservatives  pregnant or trying to get pregnant  breast-feeding How should I use this medicine? This drug is usually given as an infusion into a vein. It is administered in a hospital or clinic by  a specially trained health care professional. Talk to your pediatrician regarding the use of this medicine in children. Special care may be needed. Overdosage: If you think you have taken too much of this medicine contact a poison control center or emergency room at once. NOTE: This medicine is only for you. Do not share this medicine with others. What if I miss a dose? It is important not to miss a dose. Call your doctor or health care professional if you are unable to keep an appointment. What may interact with this medicine?  medicines for seizures  medicines to increase blood counts like filgrastim, pegfilgrastim, sargramostim  some antibiotics like amikacin, gentamicin, neomycin, streptomycin, tobramycin  vaccines Talk to your doctor or health care professional before taking any of these medicines:  acetaminophen  aspirin  ibuprofen  ketoprofen  naproxen This list may not describe all possible interactions. Give your health care provider a list of all the medicines, herbs, non-prescription drugs, or dietary supplements you use. Also tell them if you smoke, drink alcohol, or use illegal drugs. Some items may interact with your medicine. What should I watch for while using this medicine? Your condition will be monitored carefully while you are receiving this medicine. You will need important blood work done while you are taking this medicine. This drug may make you feel generally unwell. This is not uncommon, as chemotherapy can affect healthy cells as well as cancer cells. Report any side effects. Continue your course of treatment even though you feel ill unless your doctor tells you to stop. In some cases, you may be given additional medicines to help with side effects. Follow all directions for their use. Call your doctor or health care professional for advice if you get a fever, chills or sore throat, or other symptoms of a cold or flu. Do not treat yourself. This drug decreases  your body's ability to fight infections. Try to avoid being around people who are sick. This medicine may increase your risk to bruise or bleed. Call your doctor or health care professional if you notice any unusual bleeding. Be careful brushing and flossing your teeth or using a toothpick because you may get an infection or bleed more easily. If you have any dental work done, tell your dentist you are receiving this medicine. Avoid taking products that contain aspirin, acetaminophen, ibuprofen, naproxen, or ketoprofen unless instructed by your doctor. These medicines may hide a fever. Do not become pregnant while taking this medicine. Women should inform their doctor if they wish to become pregnant or think they might be pregnant. There is a potential for serious side effects to an unborn child. Talk to your health care professional or pharmacist for more information. Do not breast-feed an infant while taking this medicine. What side effects may I notice from receiving this medicine? Side effects that you should report to your doctor or health care professional as soon as possible:  allergic reactions like skin rash, itching or hives, swelling of the face, lips, or tongue  signs of infection - fever or   chills, cough, sore throat, pain or difficulty passing urine  signs of decreased platelets or bleeding - bruising, pinpoint red spots on the skin, black, tarry stools, nosebleeds  signs of decreased red blood cells - unusually weak or tired, fainting spells, lightheadedness  breathing problems  changes in hearing  changes in vision  chest pain  high blood pressure  low blood counts - This drug may decrease the number of white blood cells, red blood cells and platelets. You may be at increased risk for infections and bleeding.  nausea and vomiting  pain, swelling, redness or irritation at the injection site  pain, tingling, numbness in the hands or feet  problems with balance,  talking, walking  trouble passing urine or change in the amount of urine Side effects that usually do not require medical attention (report to your doctor or health care professional if they continue or are bothersome):  hair loss  loss of appetite  metallic taste in the mouth or changes in taste This list may not describe all possible side effects. Call your doctor for medical advice about side effects. You may report side effects to FDA at 1-800-FDA-1088. Where should I keep my medicine? This drug is given in a hospital or clinic and will not be stored at home. NOTE: This sheet is a summary. It may not cover all possible information. If you have questions about this medicine, talk to your doctor, pharmacist, or health care provider.  2021 [employer] Standard (2007-10-12 14:38:05)  

## 2020-10-12 NOTE — Telephone Encounter (Signed)
Entered in error

## 2020-10-12 NOTE — Telephone Encounter (Signed)
I attempted to contact the patient to schedule for a port placement and a message was left for a return call.

## 2020-10-14 ENCOUNTER — Encounter: Payer: Self-pay | Admitting: Oncology

## 2020-10-14 DIAGNOSIS — Z7189 Other specified counseling: Secondary | ICD-10-CM | POA: Insufficient documentation

## 2020-10-14 DIAGNOSIS — C530 Malignant neoplasm of endocervix: Secondary | ICD-10-CM | POA: Insufficient documentation

## 2020-10-14 MED ORDER — DEXAMETHASONE 4 MG PO TABS: 8.0000 mg | ORAL_TABLET | Freq: Every day | ORAL | 1 refills | Status: AC

## 2020-10-14 MED ORDER — PROCHLORPERAZINE MALEATE 10 MG PO TABS: 10.0000 mg | ORAL_TABLET | Freq: Four times a day (QID) | ORAL | 1 refills | Status: AC | PRN

## 2020-10-14 MED ORDER — ONDANSETRON HCL 8 MG PO TABS: 8.0000 mg | ORAL_TABLET | Freq: Two times a day (BID) | ORAL | 1 refills | Status: AC | PRN

## 2020-10-14 MED ORDER — LIDOCAINE-PRILOCAINE 2.5-2.5 % EX CREA: TOPICAL_CREAM | CUTANEOUS | 3 refills | Status: AC

## 2020-10-14 NOTE — Progress Notes (Addendum)
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 fib on Eliquis who presented with symptoms of suprapubic pain.  She also had thoughts of harming herself.  She was noted to have AKI with an elevated serum creatinine of 3.08 on 09/19/2020.  She underwent CT abdomen and pelvis without contrast which showed bilateral hydronephrosis of unknown etiology with ureters dilated to the level of bladder.  Decompressed bladder she had left nephrostomy tube placement with improvement of renal functions.  She also underwent cystoscopy TURBT and right ureteral stent placement with biopsies.  Cystoscopy showed infiltrative nodular type mass involving trigone and left bladder neck about 2 cm with other areas in the bladder which were also suspicious for malignancy.    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  Tumor positive for CK7, CK20, PAX8 and focal positivity for ER.  CDX2 negative. +   PET scan showed 5 mm nodule adjacent to descending thoracic aorta with an SUV of 5.42 possibly pleural metastases.  Peripheral nodular lesions in the left lung base possible metastatic disease.  Extensive retroperitoneal adenopathy.  Uterus cervix and hypermetabolism of bilateral ovaries.  Hypermetabolic pelvic sidewall lymph nodes.  No evidence of osseous metastatic disease.   Interval history-patient continues to have  significant mental stress and anxiety.  She is also caring for her husband who has multiple medical problems.  No social support.  Abdominal pain is fairly well controlled with pain medications.  ECOG PS- 2 Pain scale- 4 Opioid associated constipation- no  Review of systems- Review of Systems  Constitutional: Positive for malaise/fatigue. Negative for chills, fever and weight loss.  HENT: Negative for congestion, ear discharge and nosebleeds.   Eyes: Negative for blurred vision.  Respiratory: Negative for cough, hemoptysis, sputum production, shortness of breath and wheezing.   Cardiovascular: Negative for chest pain, palpitations, orthopnea and claudication.  Gastrointestinal: Positive for abdominal pain. Negative for blood in stool, constipation, diarrhea, heartburn, melena, nausea and vomiting.  Genitourinary: Negative for dysuria, flank pain, frequency, hematuria and urgency.  Musculoskeletal: Negative for back pain, joint pain and myalgias.  Skin: Negative for rash.  Neurological: Negative for dizziness, tingling, focal weakness, seizures, weakness and headaches.  Endo/Heme/Allergies: Does not bruise/bleed easily.  Psychiatric/Behavioral: Negative for depression and suicidal ideas. The patient is nervous/anxious. The patient does not have insomnia.       Allergies  Allergen Reactions  . Lisinopril Nausea Only     Past Medical History:  Diagnosis Date  . Atrial fibrillation ([address])   . Hypertension      Past Surgical History:  Procedure Laterality Date  . APPENDECTOMY    . BREAST SURGERY    . CYSTOGRAM  09/24/2020   Procedure: CYSTOGRAM;  Surgeon: Hollice Espy, MD;  Location: [facility] ORS;  Service: Urology;;  . Consuela Mimes W/ RETROGRADES Bilateral 09/24/2020   Procedure: CYSTOSCOPY WITH RETROGRADE PYELOGRAM;  Surgeon: Hollice Espy, MD;  Location: [facility] ORS;  Service: Urology;  Laterality: Bilateral;  . CYSTOSCOPY WITH BIOPSY N/A 09/24/2020  Procedure: CYSTOSCOPY WITH BIOPSY;   Surgeon: Hollice Espy, MD;  Location: [facility] ORS;  Service: Urology;  Laterality: N/A;  . CYSTOSCOPY WITH STENT PLACEMENT Bilateral 09/24/2020   Procedure: CYSTOSCOPY WITH RIGHT STENT PLACEMENT;  Surgeon: Hollice Espy, MD;  Location: [facility] ORS;  Service: Urology;  Laterality: Bilateral;  . IR NEPHROSTOMY PLACEMENT LEFT  09/26/2020  . REDUCTION MAMMAPLASTY Bilateral 1981   pt stated she had implants with these scars/had implants removed 2009  . TRANSURETHRAL RESECTION OF BLADDER TUMOR N/A 09/24/2020   Procedure: TRANSURETHRAL RESECTION OF BLADDER TUMOR (TURBT);  Surgeon: Hollice Espy, MD;  Location: [facility] ORS;  Service: Urology;  Laterality: N/A;    Social History   Socioeconomic History  . Marital status: Married    Spouse name: Not on file  . Number of children: Not on file  . Years of education: Not on file  . Highest education level: Not on file  Occupational History  . Not on file  Tobacco Use  . Smoking status: Never Smoker  . Smokeless tobacco: Never Used  Substance and Sexual Activity  . Alcohol use: Not Currently  . Drug use: Never  . Sexual activity: Not on file  Other Topics Concern  . Not on file  Social History Narrative  . Not on file   Social Determinants of Health   Financial Resource Strain: Not on file  Food Insecurity: Not on file  Transportation Needs: Not on file  Physical Activity: Not on file  Stress: Not on file  Social Connections: Not on file  Intimate Partner Violence: Not on file    Family History  Problem Relation Age of Onset  . Breast cancer Neg Hx      Current Outpatient Medications:  .  diltiazem (CARDIZEM CD) 180 MG 24 hr capsule, Take 1 capsule (180 mg total) by mouth daily., Disp: 30 capsule, Rfl: 1 .  ELIQUIS 5 MG TABS tablet, Take 5 mg by mouth 2 (two) times daily., Disp: , Rfl:  .  LORazepam (ATIVAN) 0.5 MG tablet, Take 1 tablet (0.5 mg total) by mouth every 8 (eight) hours as needed for anxiety., Disp: 10 tablet, Rfl: 0 .   metoprolol succinate (TOPROL-XL) 50 MG 24 hr tablet, Take 1 tablet (50 mg total) by mouth daily. Take with or immediately following a meal., Disp: 30 tablet, Rfl: 1 .  mirtazapine (REMERON) 30 MG tablet, Take 1 tablet (30 mg total) by mouth at bedtime., Disp: 60 tablet, Rfl: 0 .  oxybutynin (DITROPAN-XL) 10 MG 24 hr tablet, Take 10 mg by mouth daily. (Patient not taking: Reported on 10/09/2020), Disp: , Rfl:  .  oxyCODONE (OXY IR/ROXICODONE) 5 MG immediate release tablet, Take 1 tablet (5 mg total) by mouth every 6 (six) hours as needed for moderate pain., Disp: 30 tablet, Rfl: 0 .  polyethylene glycol (MIRALAX / GLYCOLAX) 17 g packet, Take 17 g by mouth daily. (Patient not taking: Reported on 10/09/2020), Disp: 30 each, Rfl: 0 .  senna (SENOKOT) 8.6 MG TABS tablet, Take 1 tablet (8.6 mg total) by mouth 2 (two) times daily. (Patient not taking: Reported on 10/09/2020), Disp: 60 tablet, Rfl: 0 .  Vitamin D, Ergocalciferol, (DRISDOL) 1.25 MG (50000 UNIT) CAPS capsule, Take 50,000 Units by mouth once a week., Disp: , Rfl:   Physical exam: There were no vitals filed for this visit. Physical Exam Constitutional:      Comments: Sitting in a wheelchair. In no acute distress  Cardiovascular:     Rate and Rhythm: Normal  rate and regular rhythm.     Heart sounds: Normal heart sounds.  Pulmonary:     Effort: Pulmonary effort is normal.     Breath sounds: Normal breath sounds.  Abdominal:     General: There is no distension.     Tenderness: There is no abdominal tenderness.     Comments: Nephrostomy in place  Skin:    General: Skin is warm and dry.  Neurological:     Mental Status: She is alert and oriented to person, place, and time.      CMP Latest Ref Rng & Units 09/29/2020  Glucose 70 - 99 mg/dL 98  BUN 8 - 23 mg/dL 16  Creatinine 0.44 - 1.00 mg/dL 0.90  Sodium 135 - 145 mmol/L 134(L)  Potassium 3.5 - 5.1 mmol/L 3.8  Chloride 98 - 111 mmol/L 103  CO2 22 - 32 mmol/L 23  Calcium 8.9 - 10.3  mg/dL 8.6(L)  Total Protein 6.5 - 8.1 g/dL -  Total Bilirubin 0.3 - 1.2 mg/dL -  Alkaline Phos 38 - 126 U/L -  AST 15 - 41 U/L -  ALT 0 - 44 U/L -   CBC Latest Ref Rng & Units 10/01/2020  WBC 4.0 - 10.5 K/uL 10.7(H)  Hemoglobin 12.0 - 15.0 g/dL 12.1  Hematocrit 36.0 - 46.0 % 36.0  Platelets 150 - 400 K/uL 323    No images are attached to the encounter.  CT ABDOMEN PELVIS WO CONTRAST  Result Date: 09/19/2020 CLINICAL DATA:  Left lower quadrant pain. EXAM: CT ABDOMEN AND PELVIS WITHOUT CONTRAST TECHNIQUE: Multidetector CT imaging of the abdomen and pelvis was performed following the standard protocol without IV contrast. COMPARISON:  03/20/2005 FINDINGS: Lower chest: Small right pleural effusion.  No confluent opacities. Hepatobiliary: No focal hepatic abnormality. Gallbladder unremarkable. Pancreas: No focal abnormality or ductal dilatation. Spleen: No focal abnormality.  Normal size. Adrenals/Urinary Tract: Bilateral hydronephrosis. No renal or ureteral stones. The ureters are dilated to the urinary bladder which is decompressed. Adrenal glands unremarkable. Stomach/Bowel: Colonic diverticulosis diffusely. No active diverticulitis. Stomach and small bowel decompressed, unremarkable. Vascular/Lymphatic: Aortic atherosclerosis. No evidence of aneurysm or adenopathy. Reproductive: Uterus and adnexa unremarkable.  No mass. Other: Free fluid noted in the pelvis anterior to the uterus. No free air. Musculoskeletal: No acute bony abnormality. IMPRESSION: Diffuse colonic diverticulosis.  No active diverticulitis. Bilateral hydronephrosis of unknown etiology. Ureters are dilated to the bladder. No visible stones. Bladder is decompressed. Small amount of free fluid in the pelvis. Aortic atherosclerosis. Small right pleural effusion. Electronically Signed   By: Rolm Baptise M.D.   On: 09/19/2020 10:44   DG Abd 1 View  Result Date: 09/28/2020 CLINICAL DATA:  Pain and constipation EXAM: ABDOMEN - 1 VIEW  COMPARISON:  CT abdomen and pelvis September 19, 2020 FINDINGS: There is moderate stool throughout the colon. There is no bowel dilatation or air-fluid level to suggest bowel obstruction. No free air. Lung bases clear. Double-J stent on the right. Drain in left lateral abdomen. Small phleboliths noted in the pelvis. IMPRESSION: Moderate stool in colon.  No bowel obstruction or free air evident. Electronically Signed   By: Lowella Grip III M.D.   On: 09/28/2020 15:56   [place] Intraoperative  Result Date: 09/26/2020 CLINICAL DATA:  Ultrasound was provided for use by the ordering physician.  No provider Interpretation or professional fees incurred.    NM PET Image Initial (PI) Skull Base To Thigh  Result Date: 10/03/2020 CLINICAL DATA:  Initial treatment strategy for metastatic  cancer of unknown primary. EXAM: NUCLEAR MEDICINE PET SKULL BASE TO THIGH TECHNIQUE: 8.3 mCi F-18 FDG was injected intravenously. Full-ring PET imaging was performed from the skull base to thigh after the radiotracer. CT data was obtained and used for attenuation correction and anatomic localization. Fasting blood glucose: 92 mg/dl COMPARISON:  CT scan 09/19/2020 FINDINGS: Mediastinal blood pool activity: SUV max 3.33 Liver activity: SUV max NA NECK: Diffuse symmetric uptake around the vocal cords likely related to vocalization/phonation. No masses identified. No neck adenopathy the. Incidental CT findings: none CHEST: 5.5 mm nodule adjacent to the descending thoracic aorta on image 50/0 is hypermetabolic with SUV max of 9.38. This could be a pleural metastasis. Vague peripheral nodular lesions at the left lung base are hypermetabolic and although do not appear as discrete metastatic nodules could be atypical metastatic disease. SUV max ranges between 5.42 and 7.52. No enlarged or hypermetabolic mediastinal or hilar lymph nodes. Small epicardial nodes just above the hemidiaphragm are also hypermetabolic and worrisome for metastatic disease.  No breast masses, supraclavicular or axillary adenopathy. Incidental CT findings: Small bilateral pleural effusions and bibasilar atelectasis. Moderate cardiac enlargement. ABDOMEN/PELVIS: Right-sided double-J ureteral stent and left-sided nephrostomy tube with persistent hydronephrosis and significant activity involving both kidneys. Extensive retroperitoneal lymphadenopathy is hypermetabolic with SUV max of 1.82. This continues down along the iliac chains and into the pelvis. The uterus demonstrates marked hypermetabolism with SUV max of 9.29. This also involves the entire cervix. The cervical hypermetabolism is 11.04. Marked hypermetabolism of both ovaries is also noted. The right is 9.58 and the left is 9.62. There are also small hypermetabolic pelvic sidewall lymph nodes. Small node on the left measures 6 mm and the SUV max is 5.80. A lateral right-sided external iliac lymph node measures 7.5 mm and the SUV max is 14.46. Small but hypermetabolic inguinal lymph nodes consistent with metastatic disease. Incidental CT findings: none SKELETON: No findings suspicious for osseous metastatic disease. Incidental CT findings: none IMPRESSION: 1. PET-CT findings consistent with gynecologic malignancy. 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 2. Left-sided pleural and pulmonary metastatic disease suspected. Electronically Signed   By: Marijo Sanes M.D.   On: 10/03/2020 17:11   DG OR UROLOGY CYSTO IMAGE ([place])  Result Date: 09/24/2020 There is no interpretation for this exam.  This order is for images obtained during a surgical procedure.  Please See "Surgeries" Tab for more information regarding the procedure.   IR NEPHROSTOMY PLACEMENT LEFT  Result Date: 09/26/2020 INDICATION: History of bladder mass, post failed attempt at left-sided ureteral stent placement (note, a right-sided ureteral stent was able to be  placed). As such, the patient presents now for image guided placement of a left-sided nephrostomy catheter for urinary diversion purposes. EXAM: 1. ULTRASOUND GUIDANCE FOR PUNCTURE OF THE LEFT RENAL COLLECTING SYSTEM 2. LEFT PERCUTANEOUS NEPHROSTOMY TUBE PLACEMENT. COMPARISON:  CT abdomen and pelvis-09/19/2020 MEDICATIONS: The patient is currently admitted to the hospital receiving intravenous antibiotics; The antibiotic was administered in an appropriate time frame prior to skin puncture. ANESTHESIA/SEDATION: Moderate (conscious) sedation was employed during this procedure. A total of Versed 1 mg and Fentanyl 50 mcg was administered intravenously. Moderate Sedation Time: 10 minutes. The patient's level of consciousness and vital signs were monitored continuously by radiology nursing throughout the procedure under my direct supervision. CONTRAST:  10 cc Isovue 300 - administered into the renal collecting system FLUOROSCOPY TIME:  1 minute, 6 seconds (99.3 mGy) COMPLICATIONS: None  immediate. PROCEDURE: The procedure, risks, benefits, and alternatives were explained to the patient. Questions regarding the procedure were encouraged and answered. The patient understands and consents to the procedure. A timeout was performed prior to the initiation of the procedure. The left flank region was prepped and draped in the usual sterile fashion and a sterile drape was applied covering the operative field. A sterile gown and sterile gloves were used for the procedure. Local anesthesia was provided with 1% Lidocaine with epinephrine. Ultrasound was used to localize the left kidney. Under direct ultrasound guidance, a 20 gauge needle was advanced into the renal collecting system. An ultrasound image documentation was performed. Access within the collecting system was confirmed with the efflux of urine followed by limited contrast injection. Over a Nitrex wire, the tract was dilated with an Accustick stent. Next, under  intermittent fluoroscopic guidance and over a short Amplatz wire, the track was dilated ultimately allowing placement of a 10-French percutaneous nephrostomy catheter which was advanced to the level of the renal pelvis where the coil was formed and locked. Contrast was injected and several spot fluoroscopic images were obtained in various obliquities. The catheter was secured at the skin with a Prolene retention suture and stat lock device and connected to a gravity bag was placed. Dressings were applied. The patient tolerated procedure well without immediate postprocedural complication. FINDINGS: Ultrasound scanning demonstrates a mild to moderately dilated left renal collecting system, similar to abdominal CT performed 09/19/2020. Under a combination of ultrasound and fluoroscopic guidance, a posterior inferior calix was targeted allowing placement of a 10-French percutaneous nephrostomy catheter with end coiled and locked within the renal pelvis. Contrast injection confirmed appropriate positioning. IMPRESSION: Successful ultrasound and fluoroscopic guided placement of a left sided 10 French PCN. PLAN: - Maintain left-sided nephrostomy catheter to gravity bag. - The patient may return to interventional radiology for attempted antegrade placement of a ureteral stent in 3-4 weeks as deemed appropriate by referring urologist, Dr. Erlene Quan. Electronically Signed   By: Sandi Mariscal M.D.   On: 09/26/2020 15:26     Assessment and plan- Patient is a 75 y.o. 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  Given her PET scan showed 5 mm nodule adjacent to descending thoracic aorta with an SUV of 5.42 possibly pleural metastases.  Peripheral nodular lesions in the left lung base possible  metastatic disease.  Extensive retroperitoneal adenopathy.  Uterus cervix and hypermetabolism of bilateral ovaries.  Hypermetabolic pelvic sidewall lymph nodes.  No evidence of osseous metastatic disease.  Patient also seen by palliative care today and was started on lorazepam for anxiety.  She is on as needed oxycodone for her abdominal pain.  She is being followed by outpatient home palliative care and we will also refer her to outpatient psychiatry given that she has a history of suicidal ideation while she was hospitalized and continues to have significant mental stress and anxiety  In terms of systemic treatment for her malignancy given that she is not a surgical candidate because of stage IV I would recommend carbotaxol chemotherapy given IV every 3 weeks.  I will await PD-L1 testing to see if we can add Keytruda to the regimen as well.  We will hold off on a Avastin given bladder involvement and possible risk of fistula formation.  However  patient needs to have better control of her anxiety and underlying psychiatric issues before we can start chemotherapy.  We will make arrangements for port placement and possibly start chemotherapy in 1 to 2 weeks time but that will depend on patient's readiness to start treatment as well.  Treatment will be given with a palliative intent discussed risks and benefits of chemotherapy including all but not limited to nausea, vomiting, low blood counts, risk of infections and hospitalizations.  Risk of infusion reaction and peripheral neuropathy associated with Taxol..  Patient understands and agrees to proceed as planned.. We also discussed best supportive care/hospice but patient desires to try chemotherapy at this time.   Cancer Staging Endocervical adenocarcinoma [facility]) Staging form: Cervix Uteri, AJCC Version 9 - Clinical stage from 10/14/2020: FIGO Stage IVA, calculated as Stage IVB (cT4, cM1) - Signed by Sindy Guadeloupe, MD on 10/14/2020 Stage prefix: Initial  diagnosis Pelvic nodal status: Positive Pelvic nodal method of assessment: PET Para-aortic status: Positive P16 overexpression: Negative '   Visit Diagnosis 1. Encounter for antineoplastic chemotherapy   2. Endocervical adenocarcinoma ([place])   3. Goals of care, counseling/discussion      Dr. Randa Evens, MD, MPH [place] at [facility]   Please she is [phone] 10/14/2020 9:51 AM

## 2020-10-14 NOTE — Progress Notes (Signed)
START OFF PATHWAY REGIMEN - Other   [id number]:Carboplatin + Paclitaxel (5/200) q21d:   A cycle is every 21 days:     Paclitaxel      Carboplatin   **Always confirm dose/schedule in your pharmacy ordering system**  Patient Characteristics: Intent of Therapy: Non-Curative / Palliative Intent, Discussed with Patient

## 2020-10-15 ENCOUNTER — Other Ambulatory Visit: Payer: Self-pay | Admitting: *Deleted

## 2020-10-15 ENCOUNTER — Other Ambulatory Visit: Payer: Self-pay | Admitting: Oncology

## 2020-10-15 ENCOUNTER — Inpatient Hospital Stay: Payer: Medicare PPO

## 2020-10-15 DIAGNOSIS — C530 Malignant neoplasm of endocervix: Secondary | ICD-10-CM

## 2020-10-16 ENCOUNTER — Telehealth: Payer: Self-pay

## 2020-10-16 ENCOUNTER — Telehealth: Payer: Self-pay | Admitting: Primary Care

## 2020-10-16 NOTE — Telephone Encounter (Signed)
Returned call to husband, and left a message, to let him know that the Palliative NP could come out to see the patient tomorrow,10/17/20 @ 3 PM.  I requested that he call me back to let me know if this was okay with them, left my name and call back number.  I went ahead and added this the the NP's schedule.

## 2020-10-16 NOTE — Telephone Encounter (Signed)
Rec'd call from Billey Chang, NP at the [facility] requesting that the patient be seen either today or tomorrow.  He had talked with the husband and husband was in agreement with scheduling visit.  Spoke with husband regarding the Palliative referral/services and he was in agreement with scheduling a visit with our NP.  I told him that I had a call in to our NP to see if she could see patient today or tomorrow and that I had not heard back from yet yet and I would call him back as soon as I knew and he was in agreement with this.

## 2020-10-16 NOTE — Telephone Encounter (Signed)
[facility] [employer] CDx and PD-L1 requisition for [phone], collected 09/24/2020, bladder and trigone biopsy faxed to pathology with confirmation of receipt.

## 2020-10-17 ENCOUNTER — Encounter: Payer: Self-pay | Admitting: Primary Care

## 2020-10-17 ENCOUNTER — Other Ambulatory Visit: Payer: Self-pay

## 2020-10-17 ENCOUNTER — Telehealth: Payer: Self-pay

## 2020-10-17 ENCOUNTER — Other Ambulatory Visit: Payer: Medicare PPO | Admitting: Primary Care

## 2020-10-17 DIAGNOSIS — I482 Chronic atrial fibrillation, unspecified: Secondary | ICD-10-CM

## 2020-10-17 DIAGNOSIS — Z515 Encounter for palliative care: Secondary | ICD-10-CM

## 2020-10-17 DIAGNOSIS — F411 Generalized anxiety disorder: Secondary | ICD-10-CM

## 2020-10-17 DIAGNOSIS — I426 Alcoholic cardiomyopathy: Secondary | ICD-10-CM

## 2020-10-17 DIAGNOSIS — I4891 Unspecified atrial fibrillation: Secondary | ICD-10-CM

## 2020-10-17 DIAGNOSIS — C530 Malignant neoplasm of endocervix: Secondary | ICD-10-CM

## 2020-10-17 NOTE — Telephone Encounter (Signed)
Pathology review is complete.

## 2020-10-17 NOTE — Progress Notes (Signed)
[facility] Palliative Care Consult Note Telephone: [phone]  Fax: [phone]    Date of encounter: 10/17/20 PATIENT NAME: Gloria Gill [place] [address]   [phone] (home)  DOB: [birth year] MRN: [id number] PRIMARY CARE PROVIDER:    [name], MD,  [place] [address] [phone]  REFERRING PROVIDER:   Irean Hong, NP [place],  [place] [address] [phone]  RESPONSIBLE PARTY:    Contact Information    Name Relation Home Work [place] [phone]        I met face to face with patient and family in  home. Palliative Care was asked to follow this patient by consultation request of   Borders, Kirt Boys, NP  to address advance care planning and complex medical decision making. This is the initial visit.   ASSESSMENT AND RECOMMENDATIONS:   1. Advance Care Planning/Goals of Care: Goals include to maximize quality of life and symptom management. Our advance care planning conversation included a discussion about:     The value and importance of advance care planning - No wills or living wills have been made  Exploration of personal, cultural or spiritual beliefs that might influence medical decisions   Exploration of goals of care in the event of a sudden injury or illness   Identification of a healthcare agent - Husband by default  Creation of an  advance directive document- MOST form  Advanced care plans:  Today we discussed the MOST form. Patient states she had heard of it but had never done one.  She created one with full scope of interventions, FULL CODE.   We discussed her upcoming chemotherapy. She states she does want to proceed,  however also states she is too fatigued to keep her appointments. We discussed that what was planned is  palliative chemo and that meant that the goal was to limit the progression, not cure, the  cancer. She stated that she did not have the EMLA cream and appeared to perseverate on this issue. However, She has not been able to have the port placement yet.   I encouraged her that we let her know we could get the cream easily. She did reiterate she wanted to take the chemo but also asked to cancel tomorrow's appointment and would reschedule when she feels up to it.   I spent 30 minutes providing this consultation,  from 1600 to 1630.  More than 50% of the time in this consultation was spent in counseling and care coordination.  ______________________________________________________________ 2. Symptom Management:   I met with patient today in her home. She was lying in bed,  rather listless. She endorses profound fatigue and depression.  Many in home needs exist. I am pursueing home health agency admission who can provide services of PT, OT for mobility, DME assessment, RN for med management and care of nephrostomy, [facility] for ADLS, and SW to identify community services.  Medication: She states she takes her meds out of the bottles back there by herself. She appears to been taking 75 mg of metoprolol. According to her record she should only be on 50 mg  so this was clarified. She has started the mirtazapine. Medication literacy however is poor. May benefit from [place]- pre packaged.  Depression: Endorses and has somatic signs. Taking mirtazapine and appears to be tolerating well. Recommend to increase dosage if helpful with mood and intake. Continue to  follow for efficacy. Also has psych referral  Pending. May be able to get some in home SW visits.   Nutrition: She endorses poor intake and poor appetite but  she is drinking nutritional supplements. She states however no one is there to help her with meals.  Continue with mirtazapine and increase, as above.  Fall Risk: From fatigue, deconditioning. Home is cluttered with fall risks, Pet in home.  Education provided RE safe ambulation and  help with ambulation.  Self-care deficits. Her husband and she are both chronically ill and debilitated. We discussed her needs for help with ADLs. I've encouraged them to hire in-home services. Patient stated she could afford this. Mr. Kathrin Ruddy stated that they had  used [facility] for a family member and would like me to have them phone him for more information.   Transportation: We discussed that they have to use van transportation to appointments. Husband states he does not feel secure to drive anymore and is on continual oxygen.  Wife uses [facility] transport to hospital We discussed the [facility] transportation service which  he could use if he has a [facility] doctor.  He can also use [facility] as well as the one that patient uses. Transportation barriers make getting to appointments onerous.  Caregiver strain:  she endorses basically being her own caregiver, endorses that no one helps her. Her husband is in the home but he is in poor physical poor medical condition as well, and likely needs help with his own ADLs.  Discussed getting a hospital bed should she want that for bed  and transfer mobility. She stated she was able to get up and do some of her ADLs but was very depressed and didn't feel like doing anything on  most days. Patient needs should be assessed in the context of her family unit which is her husband. She states she has no friends or other family she could call on. I would encourage her  to hire caregivers as soon as possible. I will also contact our SW for home visit for referrals.   3. Follow up Palliative Care Visit: Palliative care will continue to follow for goals of care clarification and symptom management. Return 1-2 weeks or prn.  4. Family /Caregiver/Community Supports: Lives with husband and dog in their home. Denies community supports but has used [facility] transport. Needs home health.  5. Cognitive / Functional decline:  A and O x 2, difficulty with problem solving. Depressed.  Functional ability poor for iadls, limited for adls.  This visit was coded based on medical decision making (MDM).  CODE STATUS: FULL CODE  PPS: 40%  HOSPICE ELIGIBILITY/DIAGNOSIS: yes with concordant goals of care.However is planning on pursuing chemo course.  CHIEF COMPLAINT: Fatigue due to cancer, self care deficits   HISTORY OF PRESENT ILLNESS:  SKYLENE DEREMER is a 75 y.o. 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 This cancer was diagnosed in past 2-3 weeks. Also presented with AKI likely due to tumor  encroachment, has nephrostomy tube in L kidney.   Pt also has history of alcohol abuse and depression. Presents today with constant and  profound fatigue in context of this metastatic disease and pre exisiting HFrEF. Pt states she cannot do any adls due to lethargy and poor endurance. Nothing has helped with her energy. She does not each much which makes her weakness worse. She does not have home supports.   History obtained from review  of EMR, discussion with primary team, and  interview with family, caregiver  and/or Ms. Brisbon. Records reviewed and summarized above.   Review lab tests Creatinine 3.08 on presentation in hospital, now improved. CA125= 399. Albumin WNL at 4.1     Review radiology results  PET of 10/09/20/IMPRESSION: 1. PET-CT findings consistent with gynecologic malignancy. 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 2. Left-sided pleural and pulmonary metastatic disease suspected.  CURRENT PROBLEM LIST:  Patient Active Problem List   Diagnosis Date Noted  . 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 Goals of care, counseling/discussion 10/14/2020  . Gynecologic malignancy ([place]) 10/09/2020  . Palliative care encounter   . Atrial fibrillation with rapid ventricular response  ([place]) 09/20/2020  . AKI (acute kidney injury) ([place]) 09/19/2020  . HFrEF (heart failure with reduced ejection fraction) ([place]) 09/19/2020  . Hydronephrosis 09/19/2020  . Suicidal ideation 09/19/2020  . Severe recurrent major depression without psychotic features ([place]) 09/19/2020  . 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 Alcohol abuse 05/06/2018  . Cardiomyopathy ([place]) 05/06/2018  . Anxiety, generalized 05/06/2018  . Atrial fibrillation ([place]) 12/08/2017   PAST MEDICAL HISTORY:  Active Ambulatory Problems    Diagnosis Date Noted  . Alcohol abuse 05/06/2018  . Atrial fibrillation ([place]) 12/08/2017  . Cardiomyopathy ([place]) 05/06/2018  . AKI (acute kidney injury) ([place]) 09/19/2020  . HFrEF (heart failure with reduced ejection fraction) ([place]) 09/19/2020  . Hydronephrosis 09/19/2020  . Suicidal ideation 09/19/2020  . Severe recurrent major depression without psychotic features ([place]) 09/19/2020  . 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 Atrial fibrillation with rapid ventricular response ([place]) 09/20/2020  . Palliative care encounter   . Anxiety, generalized 05/06/2018  . Gynecologic malignancy ([place]) 10/09/2020  . 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 Goals of care, counseling/discussion 10/14/2020   Resolved Ambulatory Problems    Diagnosis Date Noted  . No Resolved Ambulatory Problems   Past Medical History:  Diagnosis Date  . Hypertension    SOCIAL HX:  Social History   Tobacco Use  . Smoking status: Never Smoker  . Smokeless tobacco: Never Used  Substance Use Topics  . Alcohol use: Not Currently   FAMILY HX:  Family History  Problem Relation Age of Onset  . Breast cancer Neg Hx      Attempted to record family history but patient states she has no living family and does not know past family history. She is unable to provide history at this exam.  ALLERGIES:  Allergies  Allergen Reactions  . Lisinopril Nausea Only     PERTINENT MEDICATIONS:  Outpatient  Encounter Medications as of 10/17/2020  Medication Sig  . dexamethasone (DECADRON) 4 MG tablet Take 2 tablets (8 mg total) by mouth daily. Start the day after carboplatin chemotherapy for 3 days.  Marland Kitchen diltiazem (CARDIZEM CD) 180 MG 24 hr capsule Take 1 capsule (180 mg total) by mouth daily.  Marland Kitchen ELIQUIS 5 MG TABS tablet Take 5 mg by mouth 2 (two) times daily.  Marland Kitchen lidocaine-prilocaine (EMLA) cream Apply to affected area once  . LORazepam (ATIVAN) 0.5 MG tablet Take 1 tablet (0.5 mg total) by mouth every 8 (eight) hours as needed for anxiety.  . metoprolol succinate (TOPROL-XL) 50 MG 24 hr tablet Take 1 tablet (50 mg total) by mouth daily. Take with or immediately following a meal.  . mirtazapine (REMERON) 30 MG tablet Take 1 tablet (30 mg total) by mouth at bedtime.  . ondansetron (ZOFRAN) 8  MG tablet Take 1 tablet (8 mg total) by mouth 2 (two) times daily as needed for refractory nausea / vomiting. Start on day 3 after carboplatin chemo.  Marland Kitchen oxybutynin (DITROPAN-XL) 10 MG 24 hr tablet Take 10 mg by mouth daily. (Patient not taking: Reported on 10/09/2020)  . oxyCODONE (OXY IR/ROXICODONE) 5 MG immediate release tablet Take 1 tablet (5 mg total) by mouth every 6 (six) hours as needed for moderate pain.  . polyethylene glycol (MIRALAX / GLYCOLAX) 17 g packet Take 17 g by mouth daily. (Patient not taking: Reported on 10/09/2020)  . prochlorperazine (COMPAZINE) 10 MG tablet Take 1 tablet (10 mg total) by mouth every 6 (six) hours as needed (Nausea or vomiting).  Marland Kitchen senna (SENOKOT) 8.6 MG TABS tablet Take 1 tablet (8.6 mg total) by mouth 2 (two) times daily. (Patient not taking: Reported on 10/09/2020)  . Vitamin D, Ergocalciferol, (DRISDOL) 1.25 MG (50000 UNIT) CAPS capsule Take 50,000 Units by mouth once a week.   No facility-administered encounter medications on file as of 10/17/2020.     ROS  General: endorses depression EYES: denies vision changes ENMT: denies dysphagia Cardiovascular: denies chest  pain, denies DOE Pulmonary: denies cough, denies increased SOB Abdomen: endorses poor appetite,  Endorses occ nausea, endorses occ  constipation, endorses continence of bowel GU: denies dysuria, endorses continence of urine MSK:  Endorses extreme  weakness,  no falls reported but at risk Skin: denies rashes or wounds Neurological: denies pain, denies insomnia Psych: Endorses very depressed mood Heme/lymph/immuno: denies bruises, abnormal bleeding  Physical Exam: Current and past weights: last recorded 155 lbs Constitutional:  NAD General: frail appearing, thin EYES: anicteric sclera, lids intact, no discharge  ENMT: intact hearing, oral mucous membranes moist, dentition intact CV:  no LE edema Pulmonary:no increased work of breathing, no cough, room air Abdomen: intake 25-50%,  no ascites GU: deferred MSK:  + sarcopenia, moves all extremities, ambulatory  With help Skin: warm and over dry, no rashes or wounds on visible skin Neuro:  Extreme  generalized weakness, + cognitive impairment Psych: anxious affect, A and O x 2-3 Hem/lymph/immuno: no widespread bruising   Thank you for the opportunity to participate in the care of Ms. Garro.  The palliative care team will continue to follow. Please call our office at [phone] if we can be of additional assistance.   Jason Coop, NP , DNP, MPH, AGPCNP-BC, ACHPN   COVID-19 PATIENT SCREENING TOOL Asked and negative response unless otherwise noted:   Have you had symptoms of covid, tested positive or been in contact with someone with symptoms/positive test in the past 5-10 days?

## 2020-10-18 ENCOUNTER — Inpatient Hospital Stay: Payer: Medicare PPO | Admitting: Oncology

## 2020-10-18 ENCOUNTER — Inpatient Hospital Stay: Payer: Medicare PPO

## 2020-10-18 ENCOUNTER — Other Ambulatory Visit: Payer: Medicare PPO

## 2020-10-22 ENCOUNTER — Telehealth: Payer: Self-pay | Admitting: Hospice and Palliative Medicine

## 2020-10-22 ENCOUNTER — Telehealth (INDEPENDENT_AMBULATORY_CARE_PROVIDER_SITE_OTHER): Payer: Self-pay

## 2020-10-22 NOTE — Telephone Encounter (Signed)
I spoke with both patient and husband by phone.  Patient reports progressive decline with worsening weakness.  Husband had to call 911 last evening as patient was unable to stand from the toilet.  Oral intake is minimal.  Patient is spending the vast majority of each day in the bed.  We discussed options for clinic eval versus hospitalization.  We also discussed the option of hospice care at home.  Patient verbalized that she felt hospice would be the best option at the present time.  I explained that hospice would focus on comfort and end-of-life care.  Patient verbalized that she was okay with foregoing chemotherapy and other cancer treatments.  Verbal order given to [facility] for hospice.   I also discussed CODE STATUS with patient.  I explained the probable futility associated with resuscitative efforts in the setting of an untreatable and terminal cancer.  Both patient and husband verbalized agreement with DNR/DNI.  We will ask hospice to take a DNR order to patient's home.  Plan: -Hospice at home -DNR/DNI

## 2020-10-22 NOTE — Telephone Encounter (Signed)
Received a message from hospice that the family had requested services.  I tried calling both husband and patient without success.  Voicemail left for both.  Hospice would be reasonable but would like to first discuss goals with patient prior to sending the referral.

## 2020-10-22 NOTE — Telephone Encounter (Signed)
I attempted to contact the patient to schedule a port placement and a message was left for a return call. 

## 2020-10-23 ENCOUNTER — Encounter: Payer: Self-pay | Admitting: Oncology

## 2020-10-24 ENCOUNTER — Other Ambulatory Visit: Payer: Medicare PPO | Admitting: Primary Care

## 2020-10-24 ENCOUNTER — Other Ambulatory Visit: Payer: Medicare PPO

## 2020-10-29 ENCOUNTER — Encounter: Payer: Self-pay | Admitting: Oncology

## 2020-10-29 ENCOUNTER — Telehealth: Payer: Self-pay | Admitting: *Deleted

## 2020-10-29 MED ORDER — LACTULOSE 10 GM/15ML PO SOLN: 20.0000 g | Freq: Three times a day (TID) | ORAL | 1 refills | Status: AC | PRN

## 2020-10-29 NOTE — Telephone Encounter (Signed)
I attempted to contact the patient to schedule her for a port placement a message was left for a return call.

## 2020-10-29 NOTE — Telephone Encounter (Signed)
Gloria Gill called reporting that patient is having constipation not responding to Senna or suppositories and is asking if she can have prescription for lactulose. Please Arnoldo Hooker

## 2020-10-29 NOTE — Telephone Encounter (Signed)
Yes ok to do lactulose 30 ml 2 -3 times a day prn

## 2020-10-30 ENCOUNTER — Ambulatory Visit: Payer: Medicare PPO | Admitting: Urology

## 2020-11-02 ENCOUNTER — Other Ambulatory Visit: Payer: Self-pay | Admitting: *Deleted

## 2020-11-02 MED ORDER — OXYCODONE HCL 5 MG PO TABS: 5.0000 mg | ORAL_TABLET | Freq: Four times a day (QID) | ORAL | 0 refills | Status: DC | PRN

## 2020-11-05 ENCOUNTER — Other Ambulatory Visit: Payer: Self-pay | Admitting: *Deleted

## 2020-11-05 MED ORDER — MORPHINE SULFATE (CONCENTRATE) 20 MG/ML PO SOLN: ORAL | 0 refills | Status: AC

## 2020-11-05 NOTE — Telephone Encounter (Signed)
Oxycodone is not controlling patient pain and request for Roxanol from [place] her hospice nurse

## 2020-11-13 ENCOUNTER — Telehealth: Payer: Self-pay | Admitting: *Deleted

## 2020-11-13 NOTE — Telephone Encounter (Signed)
Augmentin 875mg BID for 7 days. 

## 2020-11-13 NOTE — Telephone Encounter (Signed)
Gloria Gill with Hospice called reporting that patient has left sided facial swelling , hard and painful on palpation from the left ear to the jaw. She states that patient gums are red and she thinks that patient may have an abcess and is asking for antibiotics to be ordered for her. Please advise

## 2020-11-13 NOTE — Telephone Encounter (Signed)
[facility] ORDER called to [facility] who will call prescription in to pharmacy

## 2020-11-20 ENCOUNTER — Other Ambulatory Visit: Payer: Self-pay | Admitting: Hospice and Palliative Medicine

## 2020-11-20 ENCOUNTER — Telehealth: Payer: Self-pay | Admitting: *Deleted

## 2020-11-20 ENCOUNTER — Other Ambulatory Visit: Payer: Self-pay | Admitting: *Deleted

## 2020-11-20 DIAGNOSIS — N99528 Other complication of other external stoma of urinary tract: Secondary | ICD-10-CM

## 2020-11-20 DIAGNOSIS — C530 Malignant neoplasm of endocervix: Secondary | ICD-10-CM

## 2020-11-20 MED ORDER — OXYCODONE HCL 5 MG PO TABS: 5.0000 mg | ORAL_TABLET | ORAL | 0 refills | Status: AC | PRN

## 2020-11-20 NOTE — Progress Notes (Signed)
Hospice nurse requested refill of oxycodone. Rx sent to pharmacy. #[phone]

## 2020-11-20 NOTE — Telephone Encounter (Signed)
Called Vicki in IR and she will check with PA and let me know what to do

## 2020-11-20 NOTE — Telephone Encounter (Signed)
Judeen Hammans can you speak to IR and see if they can reposition the tube?

## 2020-11-20 NOTE — Telephone Encounter (Signed)
Hospice nurse called reporting that patient Nephrostomy tube is not draining into her bag, it is leaking all around the tube and she is asking what surgeon inserted it. Looks like IR Dr Pascal Lux did. Please advise

## 2020-11-20 NOTE — Telephone Encounter (Signed)
Hospice nurse Deardre called back and IR is willing to look at tube and if it needs to be exchanged then the only day is Friday arrival 9:30 for 10:30 procedure at [facility]. Nothing to eat or drink for 8 hours and nurse says that she hardly eats and drinks anything. I told her she has to have transportation to and from the  Procedure and she says they will have to get ems to bring her. Also I told her that she needs to call and schedule that ahead of time for EMS. She said she will let us know

## 2020-11-23 ENCOUNTER — Ambulatory Visit
Admission: RE | Admit: 2020-11-23 | Discharge: 2020-11-23 | Disposition: A | Discharge status: Home / Self Care | Source: Ambulatory Visit | Attending: Oncology | Admitting: Oncology

## 2020-11-23 ENCOUNTER — Other Ambulatory Visit: Payer: Self-pay

## 2020-11-23 DIAGNOSIS — N99528 Other complication of other external stoma of urinary tract: Secondary | ICD-10-CM | POA: Diagnosis present

## 2020-11-23 DIAGNOSIS — C530 Malignant neoplasm of endocervix: Secondary | ICD-10-CM | POA: Diagnosis not present

## 2020-11-23 HISTORY — PX: IR NEPHROSTOMY EXCHANGE LEFT: IMG6069

## 2020-11-23 MED ORDER — ONDANSETRON 4 MG PO TBDP
4.0000 mg | ORAL_TABLET | Freq: Once | ORAL | Status: AC
  Administered 2020-11-23: 4 mg via ORAL

## 2020-11-23 MED ORDER — ONDANSETRON 4 MG PO TBDP
ORAL_TABLET | ORAL | Status: AC
  Filled 2020-11-23: qty 1

## 2020-11-23 NOTE — Procedures (Signed)
Interventional Radiology Procedure Note  Procedure: Image guided exchange of left PCN, secondary to complete blockage from debris.  New 65F drain to gravity.  Complications: None  EBL: None    Recommendations: - Routine drain care  Dc now - routine wound care  Signed,  Dulcy Fanny. Earleen Newport, DO

## 2020-12-19 DEATH — deceased

## 2021-01-09 ENCOUNTER — Ambulatory Visit: Payer: Medicare PPO

## 2021-07-02 IMAGING — MG DIGITAL SCREENING BILAT W/ TOMO W/ CAD
8 series · 8 of 24 positions shown · non-contrast
Comparison: Previous exam(s).

ACR Breast Density Category a: The breast tissue is almost entirely
fatty.

CLINICAL DATA: Screening. History of reduction and remote silicone
implants status post removal.

EXAM:
DIGITAL SCREENING BILATERAL MAMMOGRAM WITH TOMO AND CAD

[L MLO synth-2D]
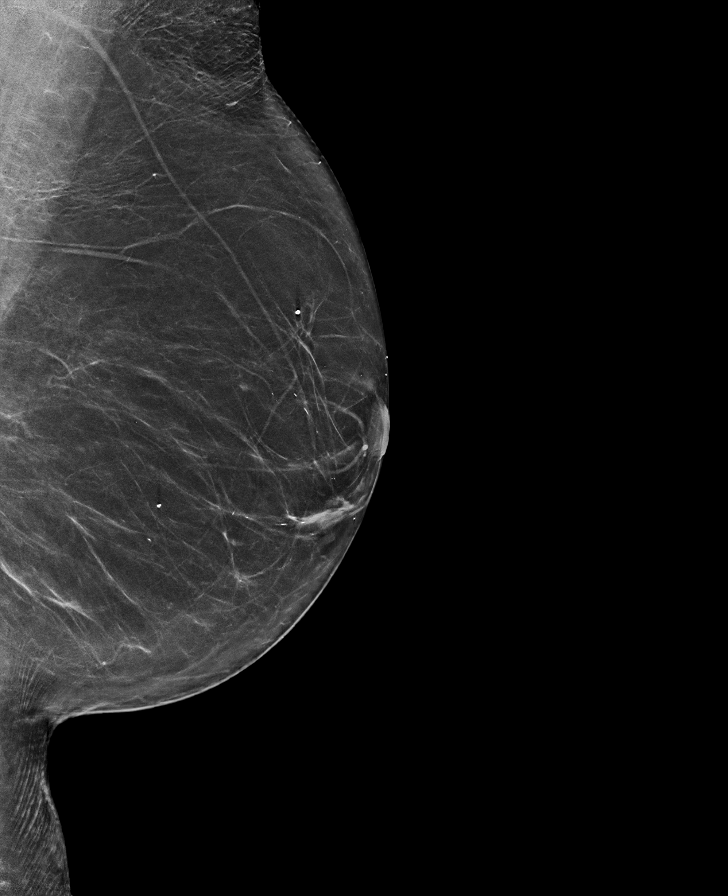

[R CC synth-2D]
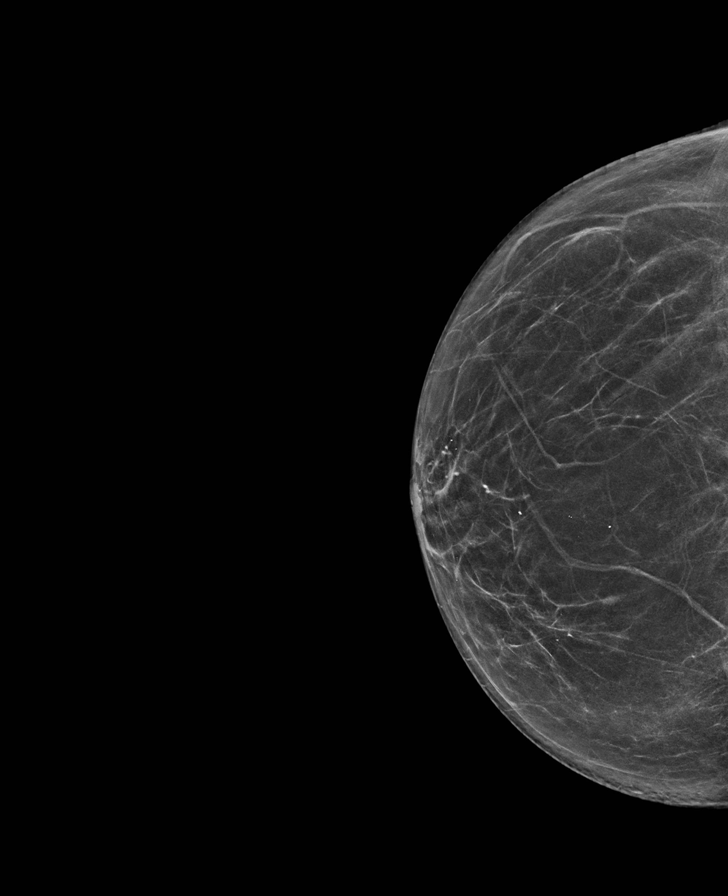

[R MLO synth-2D]
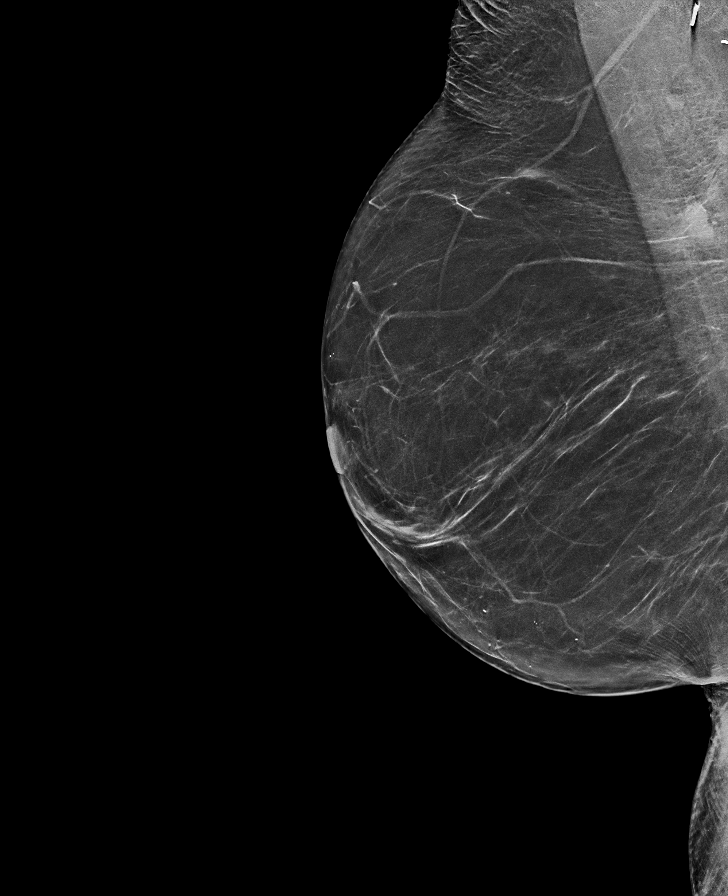

[L CC synth-2D]
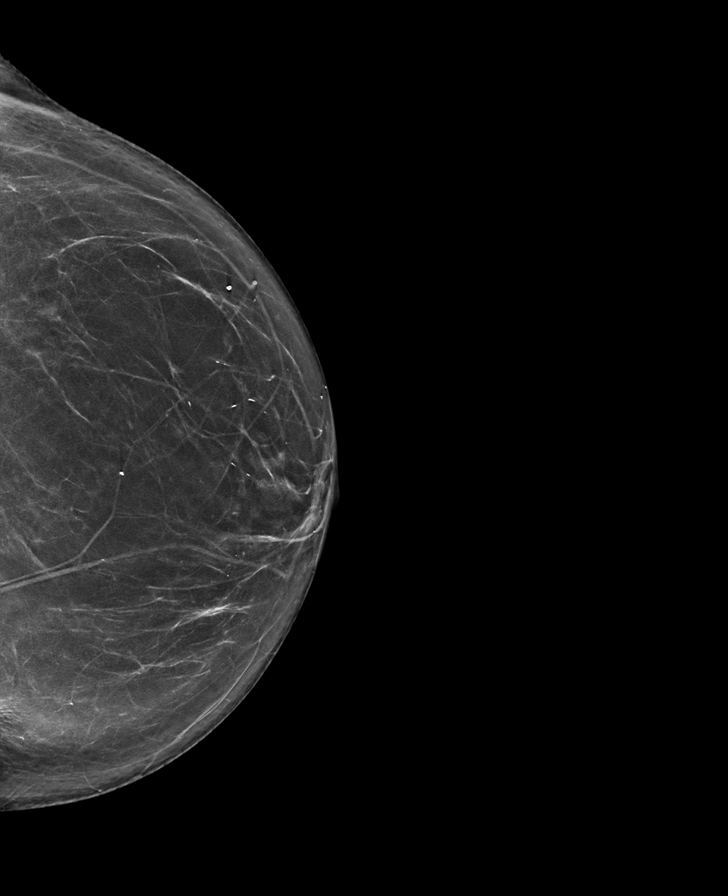

[L MLO tomo · tomo slice 37/74.0]
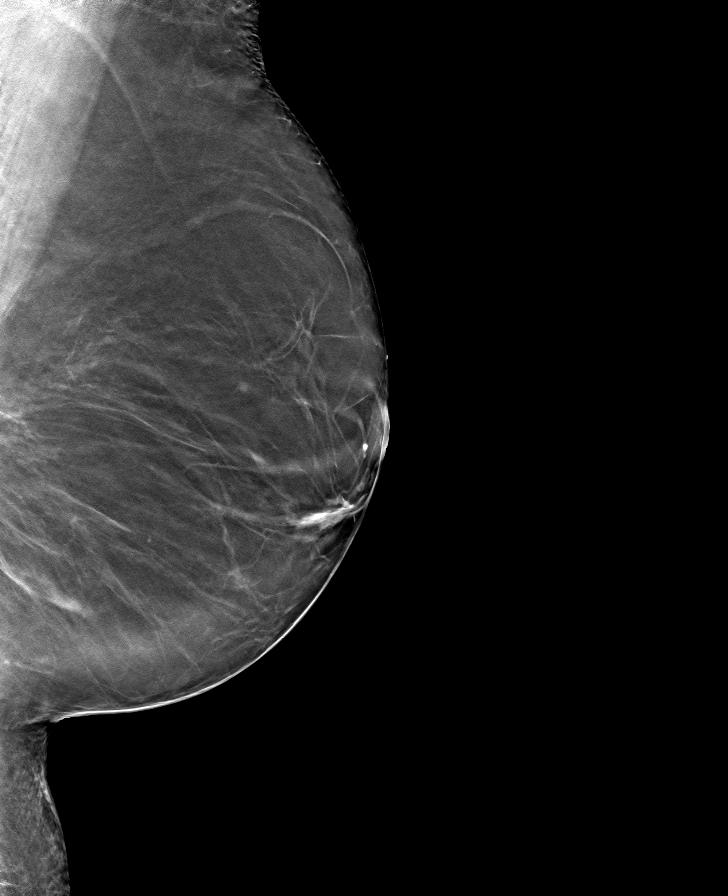

[R CC tomo · tomo slice 33/66.0]
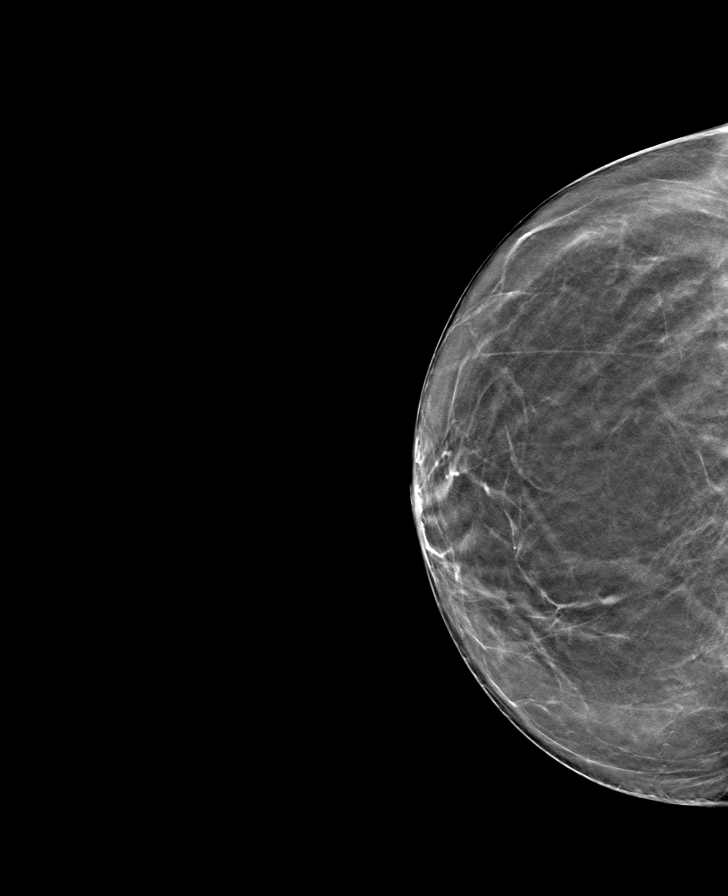

[L CC tomo · tomo slice 35/70.0]
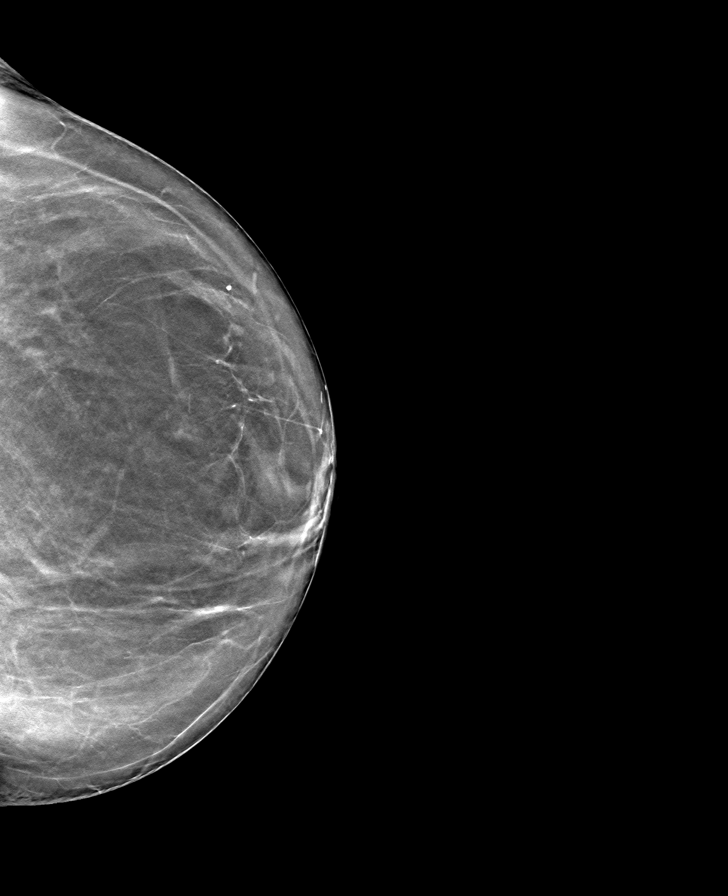

[R MLO tomo · tomo slice 34/67.0]
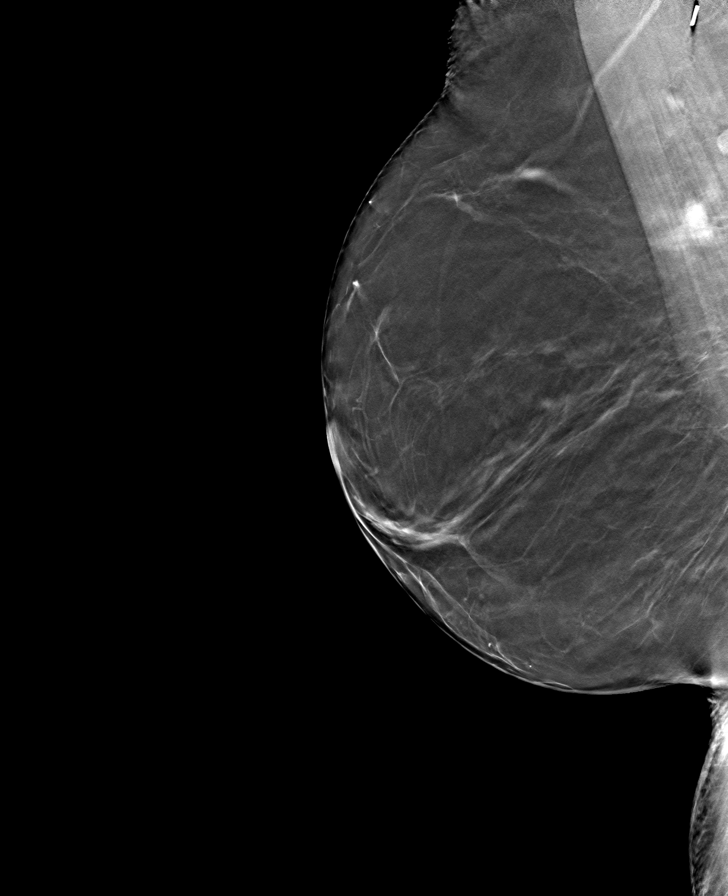

[8 of 24 positions shown; findings below may reference images not displayed]

FINDINGS: There are no findings suspicious for malignancy. There is retained
silicone within the RIGHT breast seen on MLO view only, similar in
appearance since December 20, 2007. Post reduction changes bilaterally.
Images were processed with CAD.
IMPRESSION: No mammographic evidence of malignancy. A result letter of this
screening mammogram will be mailed directly to the patient.

RECOMMENDATION:
Screening mammogram in one year. (Code:I3-1-I7J)

BI-RADS CATEGORY  2: Benign.

## 2021-09-04 IMAGING — DX DG ABDOMEN 1V
2 series · 2 of 2 positions shown · non-contrast
Comparison: CT abdomen and pelvis September 19, 2020

CLINICAL DATA: Pain and constipation

EXAM:
ABDOMEN - 1 VIEW

[abdomen supine (1 of 2)]
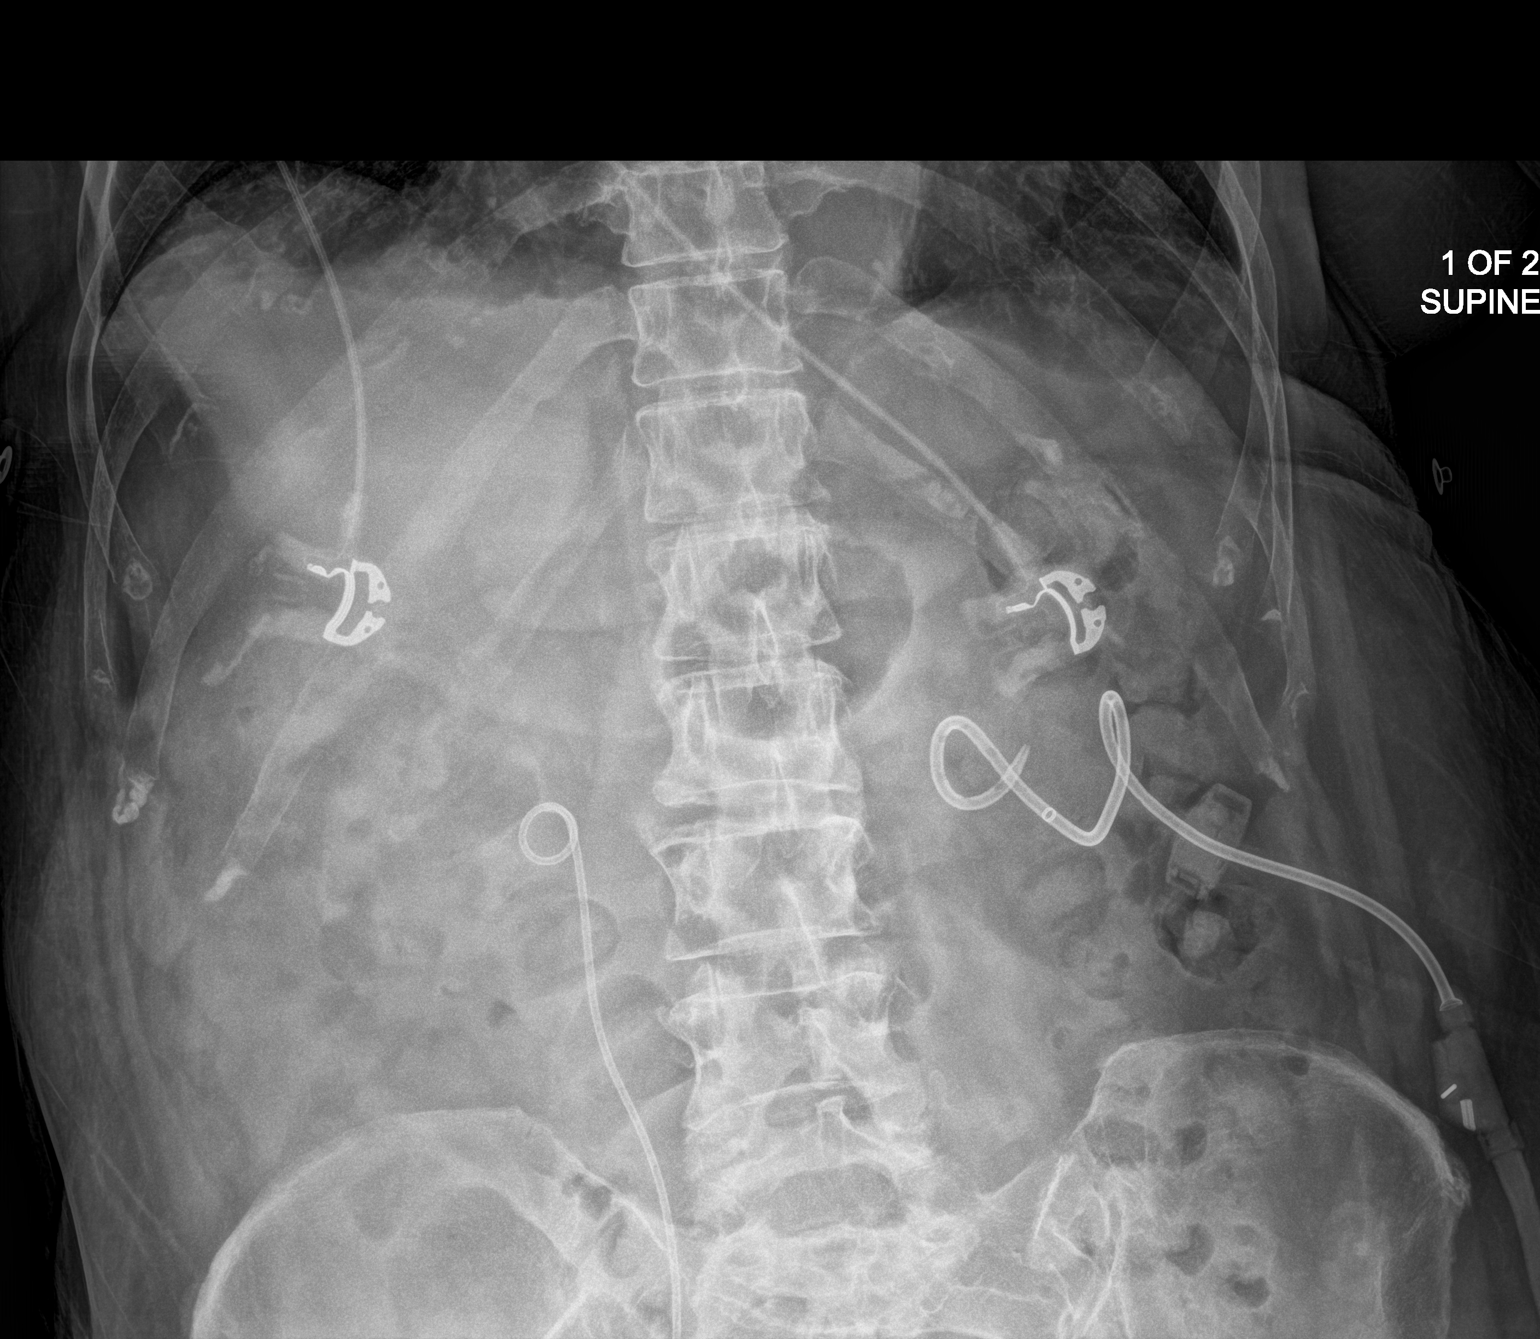

[abdomen supine (2 of 2)]
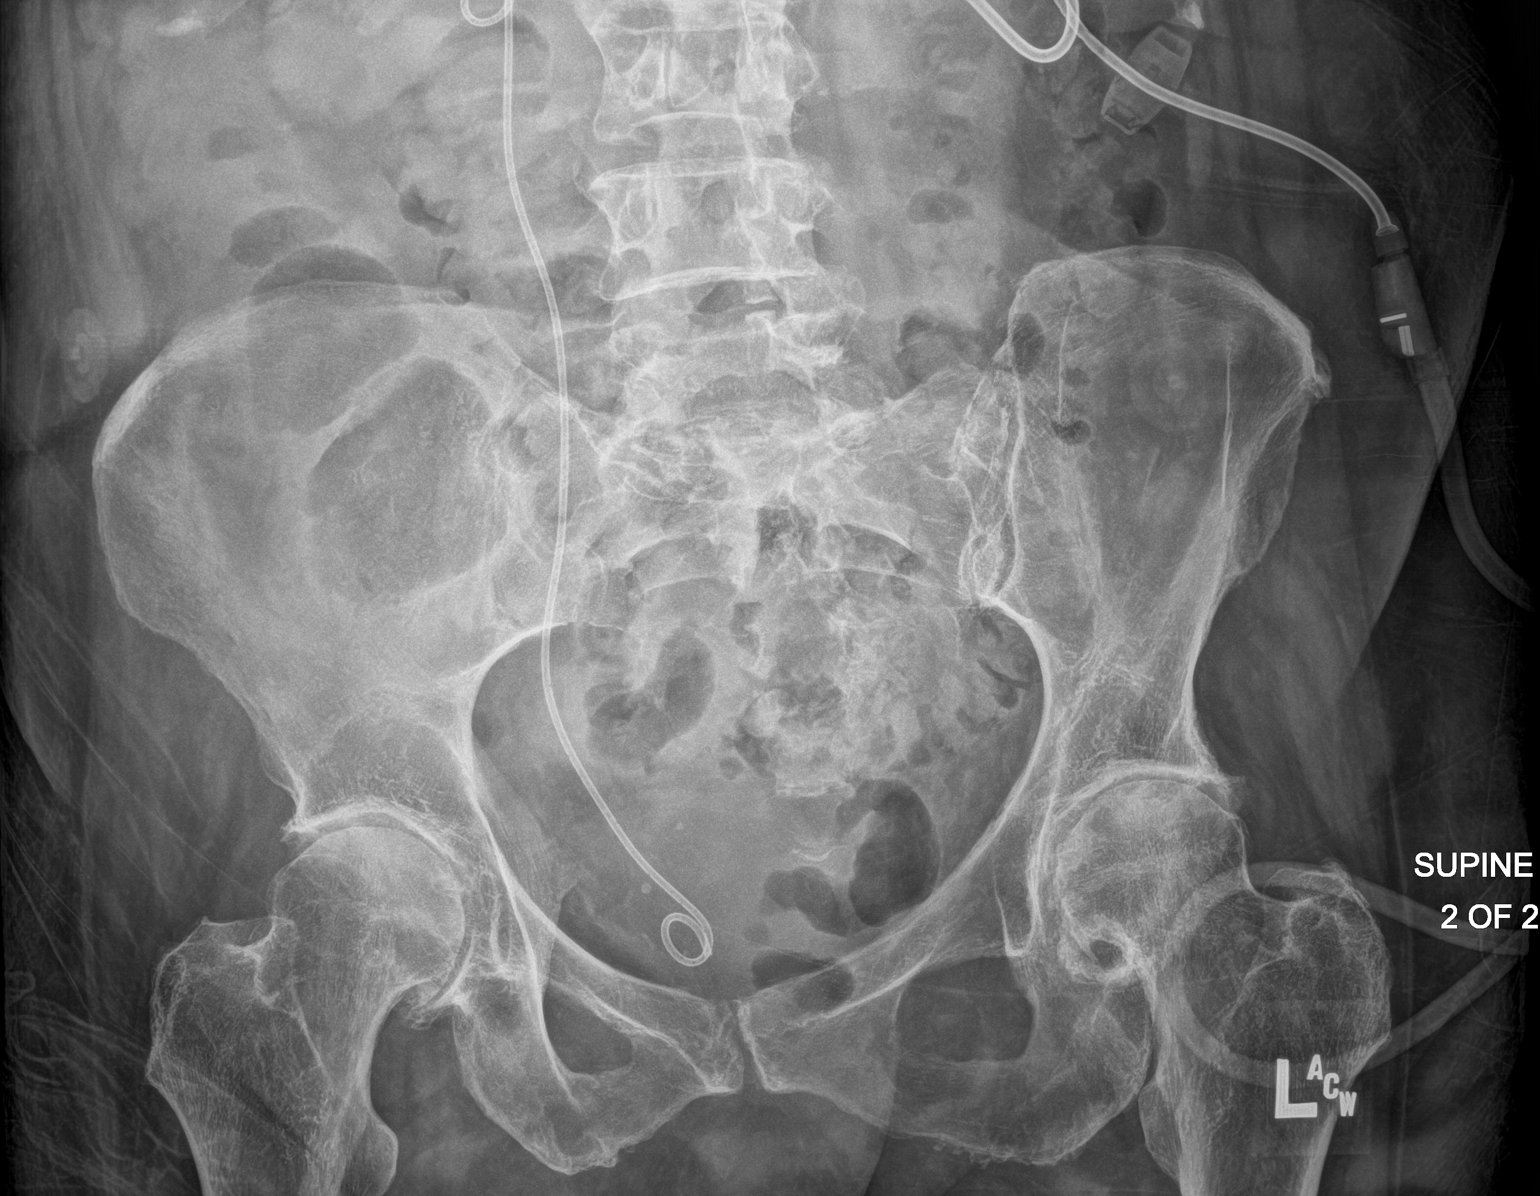

[2 of 2 positions shown; findings below may reference images not displayed]

FINDINGS: There is moderate stool throughout the colon. There is no bowel
dilatation or air-fluid level to suggest bowel obstruction. No free
air. Lung bases clear. Double-J stent on the right. Drain in left
lateral abdomen. Small phleboliths noted in the pelvis.
IMPRESSION: Moderate stool in colon.  No bowel obstruction or free air evident.
